# Patient Record
Sex: Female | Born: 1970
Health system: Southern US, Community
[De-identification: ages and names within clinical notes are randomized; demographics above are authoritative.]

## PROBLEM LIST (undated history)

## (undated) DIAGNOSIS — M359 Systemic involvement of connective tissue, unspecified: Secondary | ICD-10-CM

## (undated) DIAGNOSIS — F419 Anxiety disorder, unspecified: Secondary | ICD-10-CM

## (undated) DIAGNOSIS — Z87442 Personal history of urinary calculi: Secondary | ICD-10-CM

## (undated) DIAGNOSIS — I7300 Raynaud's syndrome without gangrene: Secondary | ICD-10-CM

## (undated) DIAGNOSIS — I10 Essential (primary) hypertension: Secondary | ICD-10-CM

## (undated) DIAGNOSIS — N200 Calculus of kidney: Secondary | ICD-10-CM

## (undated) DIAGNOSIS — B019 Varicella without complication: Secondary | ICD-10-CM

## (undated) DIAGNOSIS — N938 Other specified abnormal uterine and vaginal bleeding: Secondary | ICD-10-CM

## (undated) HISTORY — DX: Raynaud's syndrome without gangrene: I73.00

## (undated) HISTORY — DX: Varicella without complication: B01.9

## (undated) HISTORY — DX: Calculus of kidney: N20.0

## (undated) HISTORY — PX: TONSILLECTOMY: SUR1361

## (undated) HISTORY — DX: Other specified abnormal uterine and vaginal bleeding: N93.8

---
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
## 1994-02-06 HISTORY — PX: DILATION AND CURETTAGE OF UTERUS: SHX78

## 2007-02-09 ENCOUNTER — Ambulatory Visit: Payer: Self-pay | Admitting: Family Medicine

## 2007-02-14 ENCOUNTER — Ambulatory Visit: Payer: Self-pay | Admitting: Internal Medicine

## 2008-08-31 ENCOUNTER — Ambulatory Visit: Payer: Self-pay | Admitting: Unknown Physician Specialty

## 2008-09-21 ENCOUNTER — Ambulatory Visit: Payer: Self-pay | Admitting: Internal Medicine

## 2009-12-14 ENCOUNTER — Ambulatory Visit: Payer: Self-pay | Admitting: Unknown Physician Specialty

## 2009-12-27 ENCOUNTER — Ambulatory Visit: Payer: Self-pay | Admitting: Unknown Physician Specialty

## 2011-01-02 ENCOUNTER — Ambulatory Visit: Payer: Self-pay | Admitting: Unknown Physician Specialty

## 2012-03-05 ENCOUNTER — Ambulatory Visit: Payer: Self-pay

## 2013-02-07 ENCOUNTER — Ambulatory Visit: Payer: Self-pay | Admitting: Physician Assistant

## 2013-02-07 LAB — RAPID INFLUENZA A&B ANTIGENS

## 2013-03-12 ENCOUNTER — Ambulatory Visit: Payer: Self-pay | Admitting: Obstetrics and Gynecology

## 2013-12-04 DIAGNOSIS — M359 Systemic involvement of connective tissue, unspecified: Secondary | ICD-10-CM | POA: Insufficient documentation

## 2014-02-06 DIAGNOSIS — F419 Anxiety disorder, unspecified: Secondary | ICD-10-CM

## 2014-02-06 HISTORY — DX: Anxiety disorder, unspecified: F41.9

## 2014-04-29 ENCOUNTER — Ambulatory Visit: Payer: Self-pay | Admitting: Physician Assistant

## 2014-05-26 ENCOUNTER — Other Ambulatory Visit: Payer: Self-pay | Admitting: Obstetrics and Gynecology

## 2014-05-26 DIAGNOSIS — Z1231 Encounter for screening mammogram for malignant neoplasm of breast: Secondary | ICD-10-CM

## 2014-06-02 ENCOUNTER — Other Ambulatory Visit: Payer: Self-pay | Admitting: Obstetrics and Gynecology

## 2014-06-02 DIAGNOSIS — Z1231 Encounter for screening mammogram for malignant neoplasm of breast: Secondary | ICD-10-CM

## 2014-06-11 ENCOUNTER — Ambulatory Visit
Admission: RE | Admit: 2014-06-11 | Discharge: 2014-06-11 | Disposition: A | Discharge status: Home / Self Care | Payer: BLUE CROSS/BLUE SHIELD | Source: Ambulatory Visit | Attending: Obstetrics and Gynecology | Admitting: Obstetrics and Gynecology

## 2014-06-11 DIAGNOSIS — Z1231 Encounter for screening mammogram for malignant neoplasm of breast: Secondary | ICD-10-CM | POA: Diagnosis not present

## 2014-07-10 ENCOUNTER — Encounter: Payer: Self-pay | Admitting: Obstetrics and Gynecology

## 2014-07-10 ENCOUNTER — Ambulatory Visit (INDEPENDENT_AMBULATORY_CARE_PROVIDER_SITE_OTHER): Payer: BLUE CROSS/BLUE SHIELD | Admitting: Obstetrics and Gynecology

## 2014-07-10 VITALS — BP 128/85 | HR 62 | Ht 64.0 in | Wt 115.4 lb

## 2014-07-10 DIAGNOSIS — B029 Zoster without complications: Secondary | ICD-10-CM | POA: Diagnosis not present

## 2014-07-10 DIAGNOSIS — Z87442 Personal history of urinary calculi: Secondary | ICD-10-CM | POA: Insufficient documentation

## 2014-07-10 DIAGNOSIS — T783XXA Angioneurotic edema, initial encounter: Secondary | ICD-10-CM | POA: Insufficient documentation

## 2014-07-10 MED ORDER — ACYCLOVIR 800 MG PO TABS
800.0000 mg | ORAL_TABLET | Freq: Two times a day (BID) | ORAL | Status: DC
Start: 2014-07-10 — End: 2014-08-18

## 2014-07-10 MED ORDER — PREDNISOLONE 5 MG (48) PO TBPK: 1.0000 | ORAL_TABLET | Freq: Four times a day (QID) | ORAL | Status: DC

## 2014-07-10 NOTE — Evaluation (Signed)
SUBJECTIVE:  The patient has a 2* day history of a painful rash on the left back of thigh extending up to buttock. Also reports swollen lymph nodes that are not tender in right neck x 1 month. PMH: generally healthy. Has not had herpes zoster in the past. Has been really tired and under a lot of stress with mother passing away 6 weeks ago and having to care for [id number] yo grandmother  OBJECTIVE: Vital signs are normal, she appears well. Typical zoster lesions noted; vesicles on erythematous bases in clusters on the left back thigh and buttock area in a dermatomal pattern.  ASSESSMENT: Herpes Zoster (shingles)  PLAN: The nature of herpes zoster is explained carefully. Lesions should be compressed/soaked with saline, topical antibiotic ointment to any infected lesions; Aloe Vera cream may help minor local pain. Intervention with antiviral agents is extremely helpful early in the course of the disease, less helpful after 2-3 days of symptoms. Postherpetic neuralgia is explained; this may occur especially in the elderly despite every attempt at prevention. Prescription for acyclovir (Zovirax) and PredPax, which may shorten the course of acute symptoms and reduce the incidence of later neuralgia.CBC and varicella labs obtained.  The patient understands these issues, and will call as needed for further care.  Freman Lapage Ines BloomerBurr, CNM

## 2014-07-10 NOTE — Evaluation (Signed)
SUBJECTIVE:  The patient has a 2* day history of a painful rash on the left back of thigh extending up to buttock. Also reports swollen lymph nodes that are not tender in right neck x 1 month. PMH: generally healthy. Has not had herpes zoster in the past. Has been really tired and under a lot of stress with mother passing away 6 weeks ago and having to care for 46 yo grandmother  OBJECTIVE: Vital signs are normal, she appears well. Typical zoster lesions noted; vesicles on erythematous bases in clusters on the left back thigh and buttock area in a dermatomal pattern.  ASSESSMENT: Herpes Zoster (shingles)  PLAN: The nature of herpes zoster is explained carefully. Lesions should be compressed/soaked with saline, topical antibiotic ointment to any infected lesions; Aloe Vera cream may help minor local pain. Intervention with antiviral agents is extremely helpful early in the course of the disease, less helpful after 2-3 days of symptoms. Postherpetic neuralgia is explained; this may occur especially in the elderly despite every attempt at prevention. Prescription for acyclovir (Zovirax) and PredPax, which may shorten the course of acute symptoms and reduce the incidence of later neuralgia.CBC and varicella labs obtained.  The patient understands these issues, and will call as needed for further care.  Melody Burr, CNM 

## 2014-07-10 NOTE — Progress Notes (Signed)
SUBJECTIVE:  The patient has a 2* day history of a painful rash on the left back of thigh extending up to buttock. Also reports swollen lymph nodes that are not tender in right neck x 1 month. PMH: generally healthy. Has not had herpes zoster in the past. Has been really tired and under a lot of stress with mother passing away 6 weeks ago and having to care for 46 yo grandmother  OBJECTIVE: Vital signs are normal, she appears well. Typical zoster lesions noted; vesicles on erythematous bases in clusters on the left back thigh and buttock area in a dermatomal pattern.  ASSESSMENT: Herpes Zoster (shingles)  PLAN: The nature of herpes zoster is explained carefully. Lesions should be compressed/soaked with saline, topical antibiotic ointment to any infected lesions; Aloe Vera cream may help minor local pain. Intervention with antiviral agents is extremely helpful early in the course of the disease, less helpful after 2-3 days of symptoms. Postherpetic neuralgia is explained; this may occur especially in the elderly despite every attempt at prevention. Prescription for acyclovir (Zovirax) and PredPax, which may shorten the course of acute symptoms and reduce the incidence of later neuralgia.CBC and varicella labs obtained.  The patient understands these issues, and will call as needed for further care.  Maks Cavallero Burr, CNM 

## 2014-07-11 LAB — CBC WITH DIFFERENTIAL/PLATELET
BASOS: 1 %
Basophils Absolute: 0 10*3/uL (ref 0.0–0.2)
EOS (ABSOLUTE): 0.1 10*3/uL (ref 0.0–0.4)
Eos: 2 %
Hematocrit: 43.2 % (ref 34.0–46.6)
Hemoglobin: 14.7 g/dL (ref 11.1–15.9)
IMMATURE GRANS (ABS): 0 10*3/uL (ref 0.0–0.1)
Immature Granulocytes: 0 %
LYMPHS: 33 %
Lymphocytes Absolute: 1.8 10*3/uL (ref 0.7–3.1)
MCH: 32.8 pg (ref 26.6–33.0)
MCHC: 34 g/dL (ref 31.5–35.7)
MCV: 96 fL (ref 79–97)
Monocytes Absolute: 0.7 10*3/uL (ref 0.1–0.9)
Monocytes: 12 %
NEUTROS ABS: 2.9 10*3/uL (ref 1.4–7.0)
Neutrophils: 52 %
Platelets: 253 10*3/uL (ref 150–379)
RBC: 4.48 x10E6/uL (ref 3.77–5.28)
RDW: 12.8 % (ref 12.3–15.4)
WBC: 5.5 10*3/uL (ref 3.4–10.8)

## 2014-07-13 LAB — VARICELLA ZOSTER ABS, IGG/IGM

## 2014-07-14 NOTE — Progress Notes (Signed)
Quick Note:  Please let her know cbc is normal a dn varicela titer is not elevated, which is good, see if she is feeling ok ______

## 2014-07-17 ENCOUNTER — Telehealth: Payer: Self-pay | Admitting: Obstetrics and Gynecology

## 2014-07-17 NOTE — Telephone Encounter (Signed)
PLEASE ADVISE.

## 2014-07-17 NOTE — Telephone Encounter (Signed)
PT IS ON ESTRDIOL AND MISSED 1 DOSE. SHE IS BLEEDING NOW LIKE A REG PERIOD. AND HASN'T SINCE LAST JULY. PT IS CONCERNED SOMETHIING IS WRONG.

## 2014-07-17 NOTE — Telephone Encounter (Signed)
Please let her know it is not uncommon to bleed occasionally while taking the hormones, especially if misses a pill. If she has been bleeding >3d, have her stop both hormones x 4 days then restart. That should fix it, and if it doesn't  To come in to be seen

## 2014-07-17 NOTE — Telephone Encounter (Signed)
Notified pt she voiced understanding 

## 2014-08-18 ENCOUNTER — Encounter: Payer: Self-pay | Admitting: Obstetrics and Gynecology

## 2014-08-18 ENCOUNTER — Ambulatory Visit (INDEPENDENT_AMBULATORY_CARE_PROVIDER_SITE_OTHER): Payer: BLUE CROSS/BLUE SHIELD | Admitting: Obstetrics and Gynecology

## 2014-08-18 VITALS — BP 125/89 | HR 82 | Ht 64.0 in | Wt 117.4 lb

## 2014-08-18 DIAGNOSIS — N950 Postmenopausal bleeding: Secondary | ICD-10-CM | POA: Diagnosis not present

## 2014-08-18 MED ORDER — PROGESTERONE MICRONIZED 100 MG PO CAPS: 100.0000 mg | ORAL_CAPSULE | Freq: Every day | ORAL | Status: DC

## 2014-08-18 NOTE — Progress Notes (Signed)
Subjective:    Donna HighlandSusan Payne Jackson is a 44 y.o., post-menopausal female who presents for concerns regarding vaginal bleeding. She has been menopausal for 3 years. Currently on cyclical HRT and has been on this regimen for 10 months. Bleeding is described as heavier than a normal period, changing protection every 4 times a day, with previous menses being light for 2 days and has occurred 2 times. Other menopausal symptoms include: none. Workup to date: pelvic ultrasound. Done 09/2014  Menstrual History: OB History    No data available      Menarche age: [id number]  Patient's last menstrual period was 08/14/2014.    The following portions of the patient's history were reviewed and updated as appropriate: allergies, current medications, past family history, past medical history, past social history, past surgical history and problem list.  Review of Systems Pertinent items are noted in HPI.    Objective:    BP 125/89 mmHg  Pulse 82  Ht 5\' 4"  (1.626 m)  Wt 117 lb 6.4 oz (53.252 kg)  BMI 20.14 kg/m2  LMP 08/14/2014 BP 125/89 mmHg  Pulse 82  Ht 5\' 4"  (1.626 m)  Wt 117 lb 6.4 oz (53.252 kg)  BMI 20.14 kg/m2  LMP 08/14/2014  General Appearance:    Alert, cooperative, no distress, appears stated age  Head:    Normocephalic, without obvious abnormality, atraumatic  Eyes:    PERRL, conjunctiva/corneas clear, EOM's intact, fundi    benign, both eyes  Ears:    Normal TM's and external ear canals, both ears  Nose:   Nares normal, septum midline, mucosa normal, no drainage    or sinus tenderness  Throat:   Lips, mucosa, and tongue normal; teeth and gums normal  Neck:   Supple, symmetrical, trachea midline, no adenopathy;    thyroid:  no enlargement/tenderness/nodules; no carotid   bruit or JVD  Back:     Symmetric, no curvature, ROM normal, no CVA tenderness  Lungs:     Clear to auscultation bilaterally, respirations unlabored  Chest Wall:    No tenderness or deformity   Heart:    Regular  rate and rhythm, S1 and S2 normal, no murmur, rub   or gallop  Breast Exam:    No tenderness, masses, or nipple abnormality  Abdomen:     Soft, non-tender, bowel sounds active all four quadrants,    no masses, no organomegaly  Genitalia:    Normal female without lesion, discharge or tenderness  Rectal:    Normal tone, normal prostate, no masses or tenderness;   guaiac negative stool  Extremities:   Extremities normal, atraumatic, no cyanosis or edema  Pulses:   2+ and symmetric all extremities  Skin:   Skin color, texture, turgor normal, no rashes or lesions  Lymph nodes:   Cervical, supraclavicular, and axillary nodes normal  Neurologic:   CNII-XII intact, normal strength, sensation and reflexes    throughout     Assessment:    Postmenopausal bleeding   Plan:    All questions answered. Endometrial biopsy - see separate procedure note. Follow up as needed.   Restart prometrium as patient had self stopped a few months ago (can't remember why), new rx sent in.  Melody Ines BloomerBurr, CNM Endometrial Biopsy Procedure Note  Pre-operative Diagnosis: PMB  Post-operative Diagnosis: normal  Indications: postmenopausal bleeding  Procedure Details   Urine pregnancy test was not done.  The risks (including infection, bleeding, pain, and uterine perforation) and benefits of the procedure were explained to the  patient and Verbal informed consent was obtained.  Antibiotic prophylaxis against endocarditis was not indicated.   The patient was placed in the dorsal lithotomy position.  Bimanual exam showed the uterus to be in the neutral position.  A Graves' speculum inserted in the vagina, and the cervix prepped with povidone iodine.  Endocervical curettage with a Kevorkian curette was performed.   A sharp tenaculum was applied to the posterior lip of the cervix for stabilization.  A sterile uterine sound was used to sound the uterus to a depth of 8cm.  A Pipelle endometrial aspirator was used to sample  the endometrium.  Sample was sent for pathologic examination.  Condition: Stable  Complications: None  Plan:  The patient was advised to call for any fever or for prolonged or severe pain or bleeding. She was advised to use NSAID as needed for mild to moderate pain. She was advised to avoid vaginal intercourse for 48 hours or until the bleeding has completely stopped.  Attending Physician Documentation: I was present for or participated in the entire procedure, including opening and closing.

## 2014-08-18 NOTE — Patient Instructions (Signed)
  Thank you for enrolling in MyChart. Please follow the instructions below to securely access your online medical record. MyChart allows you to send messages to your doctor, view your test results, manage appointments, and more.   How Do I Sign Up? 1. In your Internet browser, go to [employer] and enter [email or web address]. 2. Click on the Sign Up Now link in the Sign In box. You will see the New Member Sign Up page. 3. Enter your MyChart Access Code exactly as it appears below. You will not need to use this code after you've completed the sign-up process. If you do not sign up before the expiration date, you must request a new code.  MyChart Access Code: [email or web address] Expires: 10/17/2014 12:44 PM  4. Enter your Social Security Number ([id number]) and Date of Birth (mm/dd/yyyy) as indicated and click Submit. You will be taken to the next sign-up page. 5. Create a MyChart ID. This will be your MyChart login ID and cannot be changed, so think of one that is secure and easy to remember. 6. Create a MyChart password. You can change your password at any time. 7. Enter your Password Reset Question and Answer. This can be used at a later time if you forget your password.  8. Enter your e-mail address. You will receive e-mail notification when new information is available in MyChart. 9. Click Sign Up. You can now view your medical record.   Additional Information Remember, MyChart is NOT to be used for urgent needs. For medical emergencies, dial 911.    Place endometrial biopsy patient instructions here.

## 2014-08-20 ENCOUNTER — Encounter: Payer: Self-pay | Admitting: Obstetrics and Gynecology

## 2014-08-21 ENCOUNTER — Telehealth: Payer: Self-pay | Admitting: Obstetrics and Gynecology

## 2014-08-21 NOTE — Telephone Encounter (Signed)
Reassure patient that it can be normal after biopsy- as long as she is not saturating a pad an hour, no worries, and to take aleeve 2 tablets every 6 hours for cramping

## 2014-08-21 NOTE — Telephone Encounter (Signed)
Pt aware. Advised to contact office Monday if bleeding doesn't slow down or cramps no relived by aleve. She is taking Prometrium as rx.

## 2014-08-21 NOTE — Telephone Encounter (Signed)
After bx, still bleeding, more pain now.

## 2014-08-24 ENCOUNTER — Telehealth: Payer: Self-pay | Admitting: *Deleted

## 2014-08-24 NOTE — Telephone Encounter (Signed)
Pt is still having bleeding and cramping, states her bleeding was much worse over the weekend, today is slightly better Pt is very concerned about this, advised would speak with MNB 08/25/2014, she voiced understanding

## 2014-08-25 ENCOUNTER — Other Ambulatory Visit: Payer: Self-pay | Admitting: *Deleted

## 2014-08-25 DIAGNOSIS — N939 Abnormal uterine and vaginal bleeding, unspecified: Secondary | ICD-10-CM

## 2014-08-25 NOTE — Telephone Encounter (Signed)
Please have her come in for ultrasound and to see me afterwards

## 2014-08-25 NOTE — Telephone Encounter (Signed)
Hey can you schedule pt and us for dub??

## 2014-08-25 NOTE — Telephone Encounter (Signed)
DONE

## 2014-08-26 ENCOUNTER — Encounter: Payer: Self-pay | Admitting: Obstetrics and Gynecology

## 2014-08-26 ENCOUNTER — Ambulatory Visit (INDEPENDENT_AMBULATORY_CARE_PROVIDER_SITE_OTHER): Payer: BLUE CROSS/BLUE SHIELD | Admitting: Obstetrics and Gynecology

## 2014-08-26 ENCOUNTER — Ambulatory Visit: Payer: BLUE CROSS/BLUE SHIELD

## 2014-08-26 VITALS — BP 131/86 | HR 71 | Ht 64.0 in | Wt 116.6 lb

## 2014-08-26 DIAGNOSIS — N938 Other specified abnormal uterine and vaginal bleeding: Secondary | ICD-10-CM

## 2014-08-26 DIAGNOSIS — N939 Abnormal uterine and vaginal bleeding, unspecified: Secondary | ICD-10-CM | POA: Diagnosis not present

## 2014-08-26 NOTE — Progress Notes (Signed)
Patient ID: Donna Jackson, female   DOB: [birth year], 44 y.o.   MRN: [id number]  Here for ultrasound for PBM:   Indications: DUB Findings:  The uterus measures 6.9 x 3.7 x 4.1 cm Echo texture is homogenous, without evidence of focal masses.  The Endometrium measures 6.2 mm.  Right Ovary measures 1.6 x 1.1 x 1.4 cm. There is a dominant 6 mm follicle seen, otherwise, appears wnl. Left Ovary measures 2.5 x 1.8 x 1.8 cm. It is normal appearance. There is a simple appearing 2 cm cyst seen. Survey of the adnexa demonstrates no adnexal masses. There is no free fluid in the cul de sac.  Impression: 1. Normal appearing uterus and endometrium 2. 2 cm simple appearing left cyst.   P: reviewed scan with Donna Jackson- to stop prometrium in 3 days and stop estridiol now, to chart any bleeding seen and call me early September to restart Estrodiol at 0.5mg  and Prometrium at 100mg  as previously taken.    Donna Jackson, CNM

## 2014-08-28 ENCOUNTER — Other Ambulatory Visit: Payer: BLUE CROSS/BLUE SHIELD

## 2014-08-28 ENCOUNTER — Ambulatory Visit: Payer: BLUE CROSS/BLUE SHIELD | Admitting: Obstetrics and Gynecology

## 2014-09-22 ENCOUNTER — Ambulatory Visit (INDEPENDENT_AMBULATORY_CARE_PROVIDER_SITE_OTHER): Payer: BLUE CROSS/BLUE SHIELD | Admitting: Obstetrics and Gynecology

## 2014-09-22 ENCOUNTER — Encounter: Payer: Self-pay | Admitting: Obstetrics and Gynecology

## 2014-09-22 VITALS — BP 149/95 | HR 76 | Ht 64.0 in | Wt 114.4 lb

## 2014-09-22 DIAGNOSIS — Z780 Asymptomatic menopausal state: Secondary | ICD-10-CM

## 2014-09-22 DIAGNOSIS — N950 Postmenopausal bleeding: Secondary | ICD-10-CM | POA: Diagnosis not present

## 2014-09-22 DIAGNOSIS — Z79890 Hormone replacement therapy (postmenopausal): Secondary | ICD-10-CM | POA: Insufficient documentation

## 2014-09-22 MED ORDER — MEDROXYPROGESTERONE ACETATE 10 MG PO TABS: 30.0000 mg | ORAL_TABLET | Freq: Every day | ORAL | Status: DC

## 2014-09-22 NOTE — Progress Notes (Signed)
Patient ID: Donna Jackson, female   DOB: 12-08-1970, 44 y.o.   MRN: [id number] PMB x 1 month Normal u/s and EMB 08/2014- see chart review D/c HRT 08/28/2014 Last 2 days of heavy bleeding with cramps taking tylenol  Chief complaint: 1.  Postmenopausal bleeding.  SUBJECTIVE: Patient is a 44 year old white female, para 24, menopausal 4 years, previously on HRT therapy until August 28, 2014, who presents for follow-up regarding recent episode of postmenopausal bleeding.  Bleeding is described as extremely heavy with flooding and clots. Workup has included endometrial biopsy showing disordered proliferative endometrium without evidence of hyperplasia; pelvic ultrasound has demonstrated a grossly normal uterus with endometrial stripe measuring 6.1 mm, a 2.4 cm simple ovarian cyst on left ovary, and a dominant follicle 6 mm on right ovary.  OBJECTIVE: BP 149/95 mmHg  Pulse 76  Ht  (1.626 m)  Wt 114 lb 6.4 oz (51.891 kg)  BMI 19.63 kg/m2  LMP 09/21/2014 Patient is a pleasant, well-appearing white female in no acute distress. Back: No CVA tenderness. Abdomen: Soft, nontender, without organomegaly; Pfannenstiel skin incision well healed without evidence of hernia. Pelvic: Blood stained perineum.  Extra genitalia: Normal  BUS: Normal.  Vagina: Atrophic changes of the vaginal mucosa; thin bloody vaginal discharge with clots, burgundy in color   Cervix: No lesions; blood is coming from os.  Uterus: Mid plane, normal size and shape, nontender Adnexa: Nonpalpable, nontender   Rectovaginal: Normal.  External exam.  IMPRESSION: 1.  Postmenopausal bleeding while on HRT therapy with negative workup based on pelvic ultrasound and endometrial biopsy.  PLAN: 1.  Provera 30 g daily for 30 days to suppress bleeding. 2.  Return in 6 weeks for follow-up. 3.  Reassess need for further HRT therapy at follow-up. 4.  Alternative interventions were reviewed including possible endometrial ablation or  hysterectomy.  A total of 15 minutes were spent face-to-face with the patient during this encounter and over half of that time dealt with counseling and coordination of care.   Herold Harms, MD

## 2014-09-22 NOTE — Patient Instructions (Signed)
1. Provera 30 mg a day for 30 days. 2. MVI with iron daily. 3. Return in 6 weeks for F/U.

## 2014-09-25 ENCOUNTER — Other Ambulatory Visit: Payer: Self-pay | Admitting: Obstetrics and Gynecology

## 2014-09-25 DIAGNOSIS — J329 Chronic sinusitis, unspecified: Secondary | ICD-10-CM | POA: Insufficient documentation

## 2014-09-25 MED ORDER — AZITHROMYCIN 250 MG PO TABS: ORAL_TABLET | ORAL | Status: DC

## 2014-10-29 ENCOUNTER — Other Ambulatory Visit: Payer: Self-pay

## 2014-10-29 MED ORDER — MEDROXYPROGESTERONE ACETATE 10 MG PO TABS: 30.0000 mg | ORAL_TABLET | Freq: Every day | ORAL | Status: DC

## 2014-10-29 NOTE — Telephone Encounter (Signed)
Pt called DC and informed her she was bleeding after completing provera  for 1 month. Ok per [facility] to refill provera until pt is seen by mad on 10/4.

## 2014-11-10 ENCOUNTER — Ambulatory Visit (INDEPENDENT_AMBULATORY_CARE_PROVIDER_SITE_OTHER): Payer: BLUE CROSS/BLUE SHIELD | Admitting: Obstetrics and Gynecology

## 2014-11-10 ENCOUNTER — Encounter: Payer: Self-pay | Admitting: Obstetrics and Gynecology

## 2014-11-10 VITALS — BP 141/94 | HR 67 | Ht 64.0 in | Wt 122.5 lb

## 2014-11-10 DIAGNOSIS — N950 Postmenopausal bleeding: Secondary | ICD-10-CM

## 2014-11-10 DIAGNOSIS — Z23 Encounter for immunization: Secondary | ICD-10-CM

## 2014-11-10 MED ORDER — INFLUENZA VAC SPLIT QUAD 0.5 ML IM SUSY
0.5000 mL | PREFILLED_SYRINGE | Freq: Once | INTRAMUSCULAR | Status: AC
  Administered 2014-11-10: 0.5 mL via INTRAMUSCULAR

## 2014-11-10 NOTE — Progress Notes (Signed)
Patient ID: Donna Jackson, female   DOB: April 26, 1970, 44 y.o.   MRN: [id number]  Chief complaint: 1.  Postmenopausal bleeding. 2.  Flu shot desired   6 WEEK F/U ON PMB ON PROVERA  QD STOPPED PROVERA 9/18 THEN STARTED BLEEDING 4 DAYS LATER GIVEN MORE PROVERA UNTIL THIS VISIT PT UNSURE OF NEXT STEP   Endometrial biopsy was benign.  Patient had withdrawal bleed following a 30 day course of high-dose Provera.  She has since been placed back on the Provera regimen in order to avoid vaginal bleeding.  Options of management have been reviewed: 1.  Observation only. 2.  Cyclic progestin withdrawal with menstrual calendar, monitoring 3.  Hysteroscopy/D&C with NovaSure endometrial ablation. 4.  Hysterectomy.  Pros and cons of management options were discussed.   PLAN: 1. Patient is willing to proceed with trial of hysteroscopy/D&C with NovaSure endometrial ablation. 2.  Patient will continue to take her daily Provera until surgery. 3.  She will return for preop appointment. 4.  Flu shot is given.  A total of 15 minutes were spent face-to-face with the patient during this encounter and over half of that time dealt with counseling and coordination of care.  Herold Harms, MD

## 2014-11-10 NOTE — Patient Instructions (Addendum)
1.  Continue with Provera daily. 2.  Return for preop appointment. 3.  Hysteroscopy/D&C with NovaSure endometrial ablation is scheduled. 4.  Literature Regarding procedures is given. 5.  Flu shot given.

## 2014-11-12 ENCOUNTER — Encounter: Payer: BLUE CROSS/BLUE SHIELD | Admitting: Obstetrics and Gynecology

## 2014-11-18 ENCOUNTER — Inpatient Hospital Stay: Admission: RE | Admit: 2014-11-18 | Payer: BLUE CROSS/BLUE SHIELD | Source: Ambulatory Visit

## 2014-11-18 ENCOUNTER — Other Ambulatory Visit: Payer: BLUE CROSS/BLUE SHIELD

## 2014-11-18 ENCOUNTER — Ambulatory Visit (INDEPENDENT_AMBULATORY_CARE_PROVIDER_SITE_OTHER): Payer: BLUE CROSS/BLUE SHIELD | Admitting: Obstetrics and Gynecology

## 2014-11-18 ENCOUNTER — Encounter
Admission: RE | Admit: 2014-11-18 | Discharge: 2014-11-18 | Disposition: A | Discharge status: Home / Self Care | Payer: BLUE CROSS/BLUE SHIELD | Source: Ambulatory Visit | Attending: Obstetrics and Gynecology | Admitting: Obstetrics and Gynecology

## 2014-11-18 ENCOUNTER — Encounter: Payer: Self-pay | Admitting: Obstetrics and Gynecology

## 2014-11-18 VITALS — BP 148/90 | HR 73 | Ht 64.0 in | Wt 122.4 lb

## 2014-11-18 DIAGNOSIS — Z01818 Encounter for other preprocedural examination: Secondary | ICD-10-CM | POA: Insufficient documentation

## 2014-11-18 DIAGNOSIS — N950 Postmenopausal bleeding: Secondary | ICD-10-CM

## 2014-11-18 DIAGNOSIS — N938 Other specified abnormal uterine and vaginal bleeding: Secondary | ICD-10-CM

## 2014-11-18 HISTORY — DX: Systemic involvement of connective tissue, unspecified: M35.9

## 2014-11-18 HISTORY — DX: Essential (primary) hypertension: I10

## 2014-11-18 HISTORY — DX: Anxiety disorder, unspecified: F41.9

## 2014-11-18 LAB — CBC WITH DIFFERENTIAL/PLATELET
BASOS ABS: 0.1 10*3/uL (ref 0–0.1)
BASOS PCT: 1 %
EOS ABS: 0.1 10*3/uL (ref 0–0.7)
Eosinophils Relative: 1 %
HCT: 42.2 % (ref 35.0–47.0)
Hemoglobin: 14.2 g/dL (ref 12.0–16.0)
LYMPHS PCT: 35 %
Lymphs Abs: 2.1 10*3/uL (ref 1.0–3.6)
MCH: 32.9 pg (ref 26.0–34.0)
MCHC: 33.7 g/dL (ref 32.0–36.0)
MCV: 97.8 fL (ref 80.0–100.0)
MONO ABS: 0.8 10*3/uL (ref 0.2–0.9)
Monocytes Relative: 13 %
Neutro Abs: 3.1 10*3/uL (ref 1.4–6.5)
Neutrophils Relative %: 50 %
PLATELETS: 222 10*3/uL (ref 150–440)
RBC: 4.31 MIL/uL (ref 3.80–5.20)
RDW: 12.3 % (ref 11.5–14.5)
WBC: 6.1 10*3/uL (ref 3.6–11.0)

## 2014-11-18 LAB — RAPID HIV SCREEN (HIV 1/2 AB+AG)
HIV 1/2 Antibodies: NONREACTIVE
HIV-1 P24 ANTIGEN - HIV24: NONREACTIVE

## 2014-11-18 NOTE — Patient Instructions (Signed)
  Your procedure is scheduled on: November 23, 2014(Monday) Report to Day Surgery.[facility] To find out your arrival time please call [phone] between 1PM - 3PM on November 20, 2014(Friday).  Remember: Instructions that are not followed completely may result in serious medical risk, up to and including death, or upon the discretion of your surgeon and anesthesiologist your surgery may need to be rescheduled.    __x__ 1. Do not eat food or drink liquids after midnight. No gum chewing or hard candies.     ____ 2. No Alcohol for 24 hours before or after surgery.   ____ 3. Bring all medications with you on the day of surgery if instructed.    __x__ 4. Notify your doctor if there is any change in your medical condition     (cold, fever, infections).     Do not wear jewelry, make-up, hairpins, clips or nail polish.  Do not wear lotions, powders, or perfumes. You may wear deodorant.  Do not shave 48 hours prior to surgery. Men may shave face and neck.  Do not bring valuables to the hospital.    [facility] for any belongings or valuables.               Contacts, dentures or bridgework may not be worn into surgery.  Leave your suitcase in the car. After surgery it may be brought to your room.  For patients admitted to the hospital, discharge time is determined by your                treatment team.   Patients discharged the day of surgery will not be allowed to drive home.  Please read over the following fact sheets that you were given:    __x__ Take these medicines the morning of surgery with A SIP OF WATER:    1. Xanax  2.   3.   4.  5.  6.  ____ Fleet Enema (as directed)   ____ Use CHG Soap as directed  ____ Use inhalers on the day of surgery  ____ Stop metformin 2 days prior to surgery    ____ Take 1/2 of usual insulin dose the night before surgery and none on the morning of surgery.   ____ Stop Coumadin/Plavix/aspirin on   __x__ Stop  Anti-inflammatories on (Tylenol ok to take for pain if needed)   ____ Stop supplements until after surgery.    ____ Bring C-Pap to the hospital.

## 2014-11-18 NOTE — Progress Notes (Signed)
Subjective:    Patient is a 44 y.o. G2P1084female scheduled for hysteroscopy/D&C with NovaSure endometrial ablation. Indications for procedure are menorrhagia refractory to medical therapy.   Patient is a 44 year old white female, para 30, menopausal 4 years, previously on HRT therapy until August 28, 2014, who presents for follow-up regarding recent episode of postmenopausal bleeding. Bleeding is described as extremely heavy with flooding and clots. Workup has included endometrial biopsy showing disordered proliferative endometrium without evidence of hyperplasia; pelvic ultrasound has demonstrated a grossly normal uterus with endometrial stripe measuring 6.1 mm, a 2.4 cm simple ovarian cyst on left ovary, and a dominant follicle 6 mm on right ovary.  Pertinent Gynecological History: Menses: flow is excessive with use of many pads or tampons on heaviest days Bleeding: intermenstrual bleeding and dysfunctional uterine bleeding Contraception: none Last mammogram: normal Last pap: normal  Discussed Blood/Blood Products: no   Menstrual History: OB History    Gravida Para Term Preterm AB TAB SAB Ectopic Multiple Living   Menarche age: na  Patient's last menstrual period was 09/21/2014.    Past Medical History  Diagnosis Date  . Kidney stones   . Raynauds syndrome   . DUB (dysfunctional uterine bleeding)   . Lupus (HCC)   . Shingles 06/2014  . Hypertension   . Anxiety   . Arthritis   . Connective tissue disease [facility])     Past Surgical History  Procedure Laterality Date  . Cesarean section  1997  . Dilation and curettage of uterus  1996  . Tonsillectomy and adenoidectomy  1982  . Tonsillectomy      OB History  Gravida Para Term Preterm AB SAB TAB Ectopic Multiple Living  # Outcome Date GA Lbr Len/2nd Weight Sex Delivery Anes PTL Lv  2 Term 1997   8 lb 6.4 oz (3.81 kg) M CS-LTranv   Y  1 SAB 1996              Social History    Social History  . Marital Status: Married    Spouse Name: N/A  . Number of Children: N/A  . Years of Education: N/A   Social History Main Topics  . Smoking status: Never Smoker   . Smokeless tobacco: Never Used  . Alcohol Use: Yes     Comment: occas  . Drug Use: No  . Sexual Activity: Yes    Birth Control/ Protection: None   Other Topics Concern  . None   Social History Narrative    Family History  Problem Relation Age of Onset  . Hypertension Mother   . Heart disease Father   . Hypertension Father   . Diabetes Maternal Grandmother      (Not in a hospital admission)  Allergies  Allergen Reactions  . Celecoxib Shortness Of Breath  . Sulfa Antibiotics Shortness Of Breath  . Amlodipine Other (See Comments)    "headaches"  . Levofloxacin Other (See Comments)    questionable    Review of Systems Constitutional: No recent fever/chills/sweats Respiratory: No recent cough/bronchitis Cardiovascular: No chest pain Gastrointestinal: No recent nausea/vomiting/diarrhea Genitourinary: No UTI symptoms Hematologic/lymphatic:No history of coagulopathy or recent blood thinner use    Objective:    BP 148/90 mmHg  Pulse 73  Ht  (1.626 m)  Wt 122 lb 6 oz (55.509 kg)  BMI 21.00 kg/m2  LMP 09/21/2014  General:   Normal  Skin:   normal  HEENT:  Normal  Neck:  Supple without Adenopathy or Thyromegaly  Lungs:   Heart:              Breasts:   Abdomen:  Pelvis:  M/S   Extremeties:  Neuro:    clear to auscultation bilaterally   Normal without murmur   Not Examined   soft, non-tender; bowel sounds normal; no masses,  no organomegaly   Exam deferred to OR  No CVAT  Warm/Dry   Normal          Assessment:    Abnormal uterine bleeding (menorrhagia), refractory to conservative medical therapy   Plan:  Hysteroscopy/D&C with NovaSure endometrial ablation   Counseling: Procedure, risks, reasons, benefits and complications (including injury to bowel,  bladder, major blood vessel, ureter, bleeding, possibility of transfusion, infection, or fistula formation) reviewed in detail. Consent signed. Preop testing ordered. Instructions reviewed, including NPO after midnight.

## 2014-11-19 ENCOUNTER — Telehealth: Payer: Self-pay | Admitting: Obstetrics and Gynecology

## 2014-11-19 LAB — RPR: RPR: NONREACTIVE

## 2014-11-19 MED ORDER — MEDROXYPROGESTERONE ACETATE 10 MG PO TABS: 10.0000 mg | ORAL_TABLET | Freq: Every day | ORAL | Status: DC

## 2014-11-19 NOTE — Telephone Encounter (Signed)
Pt notified that we would send in Provera 10 mg. 1 po daily, #4, no refills.

## 2014-11-19 NOTE — Telephone Encounter (Signed)
SHE WAS HERE YESTERDAY AND SHE WANTED TO KNOW IF DR DE COULD SEND IN A RX FOR HER PROVERA SHE TOOK THE LAST ONE YESTERDAY, SHE JUST WANTED 4 PILLS TOTAL SINCE SHE IS HAVING SURGERY ON Monday AND SHE WANTED TO KNOW IF SHE COULD JUST GET TEH 4 JUST TO LAST HER SO SHE DOESN'T START BLEEDING,[facility] [place] [facility] [place]

## 2014-11-23 ENCOUNTER — Encounter: Payer: Self-pay | Admitting: *Deleted

## 2014-11-23 ENCOUNTER — Encounter
Admission: RE | Disposition: A | Discharge status: Home / Self Care | Payer: Self-pay | Source: Ambulatory Visit | Attending: Obstetrics and Gynecology

## 2014-11-23 ENCOUNTER — Ambulatory Visit: Payer: BLUE CROSS/BLUE SHIELD | Admitting: *Deleted

## 2014-11-23 ENCOUNTER — Ambulatory Visit
Admission: RE | Admit: 2014-11-23 | Discharge: 2014-11-23 | Disposition: A | Discharge status: Home / Self Care | Payer: BLUE CROSS/BLUE SHIELD | Source: Ambulatory Visit | Attending: Obstetrics and Gynecology | Admitting: Obstetrics and Gynecology

## 2014-11-23 DIAGNOSIS — M329 Systemic lupus erythematosus, unspecified: Secondary | ICD-10-CM | POA: Diagnosis not present

## 2014-11-23 DIAGNOSIS — Z882 Allergy status to sulfonamides status: Secondary | ICD-10-CM | POA: Diagnosis not present

## 2014-11-23 DIAGNOSIS — Z87442 Personal history of urinary calculi: Secondary | ICD-10-CM | POA: Diagnosis not present

## 2014-11-23 DIAGNOSIS — N950 Postmenopausal bleeding: Secondary | ICD-10-CM | POA: Insufficient documentation

## 2014-11-23 DIAGNOSIS — Z888 Allergy status to other drugs, medicaments and biological substances status: Secondary | ICD-10-CM | POA: Insufficient documentation

## 2014-11-23 DIAGNOSIS — I7300 Raynaud's syndrome without gangrene: Secondary | ICD-10-CM | POA: Diagnosis not present

## 2014-11-23 DIAGNOSIS — I10 Essential (primary) hypertension: Secondary | ICD-10-CM | POA: Insufficient documentation

## 2014-11-23 DIAGNOSIS — F419 Anxiety disorder, unspecified: Secondary | ICD-10-CM | POA: Insufficient documentation

## 2014-11-23 DIAGNOSIS — N920 Excessive and frequent menstruation with regular cycle: Secondary | ICD-10-CM | POA: Insufficient documentation

## 2014-11-23 DIAGNOSIS — M359 Systemic involvement of connective tissue, unspecified: Secondary | ICD-10-CM | POA: Diagnosis not present

## 2014-11-23 DIAGNOSIS — Z8249 Family history of ischemic heart disease and other diseases of the circulatory system: Secondary | ICD-10-CM | POA: Insufficient documentation

## 2014-11-23 DIAGNOSIS — Z8669 Personal history of other diseases of the nervous system and sense organs: Secondary | ICD-10-CM | POA: Diagnosis not present

## 2014-11-23 DIAGNOSIS — Z833 Family history of diabetes mellitus: Secondary | ICD-10-CM | POA: Diagnosis not present

## 2014-11-23 DIAGNOSIS — M1990 Unspecified osteoarthritis, unspecified site: Secondary | ICD-10-CM | POA: Insufficient documentation

## 2014-11-23 HISTORY — PX: DILATATION & CURRETTAGE/HYSTEROSCOPY WITH RESECTOCOPE: SHX5572

## 2014-11-23 SURGERY — DILATATION & CURETTAGE/HYSTEROSCOPY WITH RESECTOCOPE
Anesthesia: General | Site: Uterus | Wound class: Clean Contaminated

## 2014-11-23 MED ORDER — MIDAZOLAM HCL 2 MG/2ML IJ SOLN
INTRAMUSCULAR | Status: DC | PRN
  Administered 2014-11-23: 2 mg via INTRAVENOUS

## 2014-11-23 MED ORDER — PROPOFOL 10 MG/ML IV BOLUS
INTRAVENOUS | Status: DC | PRN
  Administered 2014-11-23: 50 mg via INTRAVENOUS
  Administered 2014-11-23: 150 mg via INTRAVENOUS

## 2014-11-23 MED ORDER — KETOROLAC TROMETHAMINE 30 MG/ML IJ SOLN
INTRAMUSCULAR | Status: DC | PRN
Start: 2014-11-23 — End: 2014-11-23
  Administered 2014-11-23: 30 mg via INTRAVENOUS

## 2014-11-23 MED ORDER — ONDANSETRON HCL 4 MG/2ML IJ SOLN
INTRAMUSCULAR | Status: DC | PRN
  Administered 2014-11-23: 4 mg via INTRAVENOUS

## 2014-11-23 MED ORDER — LIDOCAINE HCL (CARDIAC) 20 MG/ML IV SOLN
INTRAVENOUS | Status: DC | PRN
  Administered 2014-11-23: 50 mg via INTRAVENOUS

## 2014-11-23 MED ORDER — ONDANSETRON HCL 4 MG/2ML IJ SOLN: 4.0000 mg | Freq: Once | INTRAMUSCULAR | Status: DC | PRN

## 2014-11-23 MED ORDER — FENTANYL CITRATE (PF) 100 MCG/2ML IJ SOLN
INTRAMUSCULAR | Status: DC | PRN
  Administered 2014-11-23: 25 ug via INTRAVENOUS
  Administered 2014-11-23: 50 ug via INTRAVENOUS
  Administered 2014-11-23: 25 ug via INTRAVENOUS

## 2014-11-23 MED ORDER — FENTANYL CITRATE (PF) 100 MCG/2ML IJ SOLN: 25.0000 ug | INTRAMUSCULAR | Status: DC | PRN

## 2014-11-23 MED ORDER — LACTATED RINGERS IV SOLN
INTRAVENOUS | Status: DC
  Administered 2014-11-23 (×2): via INTRAVENOUS

## 2014-11-23 MED ORDER — OXYCODONE-ACETAMINOPHEN 5-325 MG PO TABS: 1.0000 | ORAL_TABLET | ORAL | Status: DC | PRN

## 2014-11-23 MED ORDER — DEXAMETHASONE SODIUM PHOSPHATE 4 MG/ML IJ SOLN
INTRAMUSCULAR | Status: DC | PRN
  Administered 2014-11-23: 4 mg via INTRAVENOUS

## 2014-11-23 SURGICAL SUPPLY — 14 items
CATH ROBINSON RED A/P 16FR (CATHETERS) ×3 IMPLANT
GLOVE BIO SURGEON STRL SZ8 (GLOVE) ×6 IMPLANT
GOWN STRL REUS W/ TWL LRG LVL3 (GOWN DISPOSABLE) ×1 IMPLANT
GOWN STRL REUS W/ TWL XL LVL3 (GOWN DISPOSABLE) ×1 IMPLANT
GOWN STRL REUS W/TWL LRG LVL3 (GOWN DISPOSABLE) ×2
GOWN STRL REUS W/TWL XL LVL3 (GOWN DISPOSABLE) ×2
IV LACTATED RINGERS 1000ML (IV SOLUTION) ×3 IMPLANT
KIT RM TURNOVER CYSTO AR (KITS) ×3 IMPLANT
NOVASURE ENDOMETRIAL ABLATION (MISCELLANEOUS) ×3 IMPLANT
NS IRRIG 500ML POUR BTL (IV SOLUTION) ×3 IMPLANT
PACK DNC HYST (MISCELLANEOUS) ×3 IMPLANT
PAD OB MATERNITY 4.3X12.25 (PERSONAL CARE ITEMS) ×3 IMPLANT
PAD PREP 24X41 OB/GYN DISP (PERSONAL CARE ITEMS) ×3 IMPLANT
SURGILUBE 2OZ TUBE FLIPTOP (MISCELLANEOUS) ×3 IMPLANT

## 2014-11-23 NOTE — H&P (View-Only) (Signed)
Subjective:    Patient is a 44 y.o. G2P1011female scheduled for hysteroscopy/D&C with NovaSure endometrial ablation. Indications for procedure are menorrhagia refractory to medical therapy.   Patient is a 44-year-old white female, para 1011, menopausal 4 years, previously on HRT therapy until August 28, 2014, who presents for follow-up regarding recent episode of postmenopausal bleeding. Bleeding is described as extremely heavy with flooding and clots. Workup has included endometrial biopsy showing disordered proliferative endometrium without evidence of hyperplasia; pelvic ultrasound has demonstrated a grossly normal uterus with endometrial stripe measuring 6.1 mm, a 2.4 cm simple ovarian cyst on left ovary, and a dominant follicle 6 mm on right ovary.  Pertinent Gynecological History: Menses: flow is excessive with use of many pads or tampons on heaviest days Bleeding: intermenstrual bleeding and dysfunctional uterine bleeding Contraception: none Last mammogram: normal Last pap: normal  Discussed Blood/Blood Products: no   Menstrual History: OB History    Gravida Para Term Preterm AB TAB SAB Ectopic Multiple Living   2 1 1  1  1   1      Menarche age: na  Patient's last menstrual period was 09/21/2014.    Past Medical History  Diagnosis Date  . Kidney stones   . Raynauds syndrome   . DUB (dysfunctional uterine bleeding)   . Lupus (HCC)   . Shingles 06/2014  . Hypertension   . Anxiety   . Arthritis   . Connective tissue disease (HCC)     Past Surgical History  Procedure Laterality Date  . Cesarean section  1997  . Dilation and curettage of uterus  1996  . Tonsillectomy and adenoidectomy  1982  . Tonsillectomy      OB History  Gravida Para Term Preterm AB SAB TAB Ectopic Multiple Living  2 1 1  1 1    1    # Outcome Date GA Lbr Len/2nd Weight Sex Delivery Anes PTL Lv  2 Term 1997   8 lb 6.4 oz (3.81 kg) M CS-LTranv   Y  1 SAB 1996              Social History    Social History  . Marital Status: Married    Spouse Name: N/A  . Number of Children: N/A  . Years of Education: N/A   Social History Main Topics  . Smoking status: Never Smoker   . Smokeless tobacco: Never Used  . Alcohol Use: Yes     Comment: occas  . Drug Use: No  . Sexual Activity: Yes    Birth Control/ Protection: None   Other Topics Concern  . None   Social History Narrative    Family History  Problem Relation Age of Onset  . Hypertension Mother   . Heart disease Father   . Hypertension Father   . Diabetes Maternal Grandmother      (Not in a hospital admission)  Allergies  Allergen Reactions  . Celecoxib Shortness Of Breath  . Sulfa Antibiotics Shortness Of Breath  . Amlodipine Other (See Comments)    "headaches"  . Levofloxacin Other (See Comments)    questionable    Review of Systems Constitutional: No recent fever/chills/sweats Respiratory: No recent cough/bronchitis Cardiovascular: No chest pain Gastrointestinal: No recent nausea/vomiting/diarrhea Genitourinary: No UTI symptoms Hematologic/lymphatic:No history of coagulopathy or recent blood thinner use    Objective:    BP 148/90 mmHg  Pulse 73  Ht 5' 4" (1.626 m)  Wt 122 lb 6 oz (55.509 kg)  BMI 21.00 kg/m2    LMP 09/21/2014  General:   Normal  Skin:   normal  HEENT:  Normal  Neck:  Supple without Adenopathy or Thyromegaly  Lungs:   Heart:              Breasts:   Abdomen:  Pelvis:  M/S   Extremeties:  Neuro:    clear to auscultation bilaterally   Normal without murmur   Not Examined   soft, non-tender; bowel sounds normal; no masses,  no organomegaly   Exam deferred to OR  No CVAT  Warm/Dry   Normal          Assessment:    Abnormal uterine bleeding (menorrhagia), refractory to conservative medical therapy   Plan:  Hysteroscopy/D&C with NovaSure endometrial ablation   Counseling: Procedure, risks, reasons, benefits and complications (including injury to bowel,  bladder, major blood vessel, ureter, bleeding, possibility of transfusion, infection, or fistula formation) reviewed in detail. Consent signed. Preop testing ordered. Instructions reviewed, including NPO after midnight.

## 2014-11-23 NOTE — Transfer of Care (Signed)
Immediate Anesthesia Transfer of Care Note  Patient: Donna Jackson  Procedure(s) Performed: Procedure(s): DILATATION & CURETTAGE/HYSTEROSCOPY WITH RESECTOCOPE (N/A)  Patient Location: PACU  Anesthesia Type:General  Level of Consciousness: sedated  Airway & Oxygen Therapy: Patient Spontanous Breathing and Patient connected to nasal cannula oxygen  Post-op Assessment: Report given to RN and Post -op Vital signs reviewed and stable  Post vital signs: Reviewed and stable  Last Vitals:  Filed Vitals:   11/23/14 0747  BP: 151/97  Pulse: 80  Temp: 36 C  Resp: 16    Complications: No apparent anesthesia complications

## 2014-11-23 NOTE — Anesthesia Postprocedure Evaluation (Signed)
  Anesthesia Post-op Note  Patient: Donna Jackson  Procedure(s) Performed: Procedure(s): DILATATION & CURETTAGE/HYSTEROSCOPY WITH RESECTOCOPE (N/A)  Anesthesia type:General  Patient location: PACU  Post pain: Pain level controlled  Post assessment: Post-op Vital signs reviewed, Patient's Cardiovascular Status Stable, Respiratory Function Stable, Patent Airway and No signs of Nausea or vomiting  Post vital signs: Reviewed and stable  Last Vitals:  Filed Vitals:   11/23/14 1013  BP: 118/79  Pulse: 51  Temp:   Resp: 17    Level of consciousness: awake, alert  and patient cooperative  Complications: No apparent anesthesia complications

## 2014-11-23 NOTE — Interval H&P Note (Signed)
History and Physical Interval Note:  11/23/2014 8:29 AM  Donna Jackson  has presented today for surgery, with the diagnosis of POST MENOPAUSAL BLEEDING  The various methods of treatment have been discussed with the patient and family. After consideration of risks, benefits and other options for treatment, the patient has consented to hysteroscopy/D&C with NovaSure endometrial ablation  as a surgical intervention .  The patient's history has been reviewed, patient examined, no change in status, stable for surgery.  I have reviewed the patient's chart and labs.  Questions were answered to the patient's satisfaction.     Daphine DeutscherMartin A Erandi Lemma

## 2014-11-23 NOTE — Anesthesia Preprocedure Evaluation (Signed)
Anesthesia Evaluation  Patient identified by MRN, date of birth, ID band Patient awake    Reviewed: Allergy & Precautions, NPO status , Patient's Chart, lab work & pertinent test results  Airway Mallampati: I  TM Distance: >3 FB Neck ROM: Limited    Dental  (+) Teeth Intact   Pulmonary    Pulmonary exam normal        Cardiovascular Exercise Tolerance: Good (-) hypertensionNormal cardiovascular exam  She has Lupus and Raynaud's syndrome. She does not have PVD.   Neuro/Psych    GI/Hepatic   Endo/Other    Renal/GU Hx of stones.     Musculoskeletal  (+) Arthritis , She has Lupus.   Abdominal Normal abdominal exam  (+)   Peds  Hematology   Anesthesia Other Findings   Reproductive/Obstetrics                             Anesthesia Physical Anesthesia Plan  ASA: III  Anesthesia Plan: General   Post-op Pain Management:    Induction: Intravenous  Airway Management Planned: LMA  Additional Equipment:   Intra-op Plan:   Post-operative Plan: Extubation in OR  Informed Consent: I have reviewed the patients History and Physical, chart, labs and discussed the procedure including the risks, benefits and alternatives for the proposed anesthesia with the patient or authorized representative who has indicated his/her understanding and acceptance.     Plan Discussed with: CRNA  Anesthesia Plan Comments: (Slender 44 yo who is post-menopausal and has Lupus.)        Anesthesia Quick Evaluation

## 2014-11-23 NOTE — Op Note (Signed)
OPERATIVE NOTE  Date of surgery: 11/23/2014  Preoperative diagnosis:  1. Postmenopausal bleeding Postoperative diagnosis:  1. Postmenopausal bleeding Operative procedure: 1. Hysteroscopy 2. Dilation and curettage of endometrium  Surgeon: Dr. Beatris Sie Francesco Assistant: Doreene NestElena Klaus, PA-S  Anesthesia: Gen.   Findings: Pelvis: Gynecoid pelvis; good support with minimal cervical descent with tenaculum traction; if hysterectomy is needed, LAVH will be recommended. Endometrial cavity with shaggy endometrium and no lesions. Narrow width with inability to perform NovaSure endometrial ablation Uterus: sounded to 7.5 7.5 cm Normal appearing endometrial cavity without gross lesions.  Description of procedure Patient was brought to the operating room where she was placed in supine position. General anesthesia was induced without difficulty using the LMA technique. The patient was placed in dorsal lithotomy position using candy cane stirrups. A betadine perineal intravaginal prep and drape was performed in standard fashion. Weighted speculum was placed in the vagina. Single-tooth tenaculum was placed on the anterior cervix. Uterus was sounded to 7.5 cm. Hegar dilators were used up to an 8 JamaicaFrench caliber to dilate the endocervical canal. The [facility] hysteroscope using lactated Ringer's as irrigant was used for the hysteroscopy. The above-noted findings were photo documented. The hysteroscope removed. The smooth and serrated curettes were then used to curettage the endometrial cavity with production of average tissue. Stone polyp forceps were used to grasp residual tissue left behind. The NovaSure endometrial ablation was then attempted. The uterus was sounded to 7.5 cm; cervical length was 3.5 cm; the uterine width was less than 1.5 cm, therefore, the NovaSure ablation could not the completed. The NovaSure instrument was removed. Repeat hysteroscopy was performed and demonstrated excellent sampling. Procedure was  then terminated with all instrumentation being removed from the vagina. The patient was then awakened mobilized and taken to the recovery room in satisfactory condition.  IV fluids: 500 mL. Urine output: 500 mL. EBL: 20 mL.  Instruments, needles, and sponge counts were verified as correct.  Herold HarmsMartin A Defrancesco, MD 11/23/2014

## 2014-11-23 NOTE — Progress Notes (Signed)
Pt advises postmenopausal - Dr Dimple Caseyice in to see pt, discussed with patient, and upreg order cancelled as instructed by Dr Dimple Caseyice

## 2014-11-24 LAB — SURGICAL PATHOLOGY

## 2014-12-01 ENCOUNTER — Encounter: Payer: Self-pay | Admitting: Obstetrics and Gynecology

## 2014-12-01 ENCOUNTER — Ambulatory Visit (INDEPENDENT_AMBULATORY_CARE_PROVIDER_SITE_OTHER): Payer: BLUE CROSS/BLUE SHIELD | Admitting: Obstetrics and Gynecology

## 2014-12-01 VITALS — BP 159/87 | HR 75 | Ht 64.0 in | Wt 119.5 lb

## 2014-12-01 DIAGNOSIS — R030 Elevated blood-pressure reading, without diagnosis of hypertension: Secondary | ICD-10-CM

## 2014-12-01 DIAGNOSIS — N950 Postmenopausal bleeding: Secondary | ICD-10-CM

## 2014-12-01 DIAGNOSIS — IMO0001 Reserved for inherently not codable concepts without codable children: Secondary | ICD-10-CM

## 2014-12-01 DIAGNOSIS — Z09 Encounter for follow-up examination after completed treatment for conditions other than malignant neoplasm: Secondary | ICD-10-CM

## 2014-12-01 NOTE — Patient Instructions (Signed)
1.  Menstrual calendar, monitoring 2.  Blood pressure diary. 3.  Return in 3 months for follow-up on abnormal uterine bleeding. 4.  Reviewed blood pressure diary with Dr. Lavenia AtlasWally Kernodle at rheumatology appointment

## 2014-12-01 NOTE — Progress Notes (Signed)
Patient ID: Donna DachSusan P Jackson, female   DOB: [birth year], 44 y.o.   MRN: [id number] 1 week post op  hysteroscopy and d&c ablation cx Only used pain meds first few days Spotting only  Chief complaint: 1.  One week postop check. 2.  Status post hysteroscopy/D&C. 3.  Endometrial ablation, discontinued due to technical difficulties.  Patient is doing well postop.  Bowel bladder function are normal.  She is having minimal spotting. Options of management for regulating her abnormal uterine bleeding were discussed.  Options included continuous HRT therapy versus hysterectomy versus nothing.  At this time, the patient would like to just monitor her bleeding with a menstrual calendar car.  She will return to see me in 3 months for reassessment.  Should she develop heavy bleeding, she may wish to proceed with hysterectomy, likely through an LAVH or Pfannenstiel open hysterectomy procedure.  OBJECTIVE: BP 159/87 mmHg  Pulse 75  Ht 5\' 4"  (1.626 m)  Wt 119 lb 8 oz (54.205 kg)  BMI 20.50 kg/m2  LMP 09/21/2014   IMPRESSION: 1.  Normal one-week postop check status post hysteroscopy, D&C. 2.  Elevated blood pressure without diagnosis of hypertension.  Plan: 1.  Blood-pressure diary 2.  Follow up with Dr. Lavenia AtlasWally Kernodle in rheumatology and review blood pressures with him. 3.  Return in 3 months for follow-up  Herold HarmsMartin A Defrancesco, MD  Note: This dictation was prepared with Dragon dictation along with smaller phrase technology. Any transcriptional errors that result from this process are unintentional.

## 2015-02-10 ENCOUNTER — Other Ambulatory Visit: Payer: Self-pay

## 2015-02-10 MED ORDER — CONJ ESTROG-MEDROXYPROGEST ACE 0.625-2.5 MG PO TABS: 1.0000 | ORAL_TABLET | Freq: Every day | ORAL | Status: DC

## 2015-02-10 NOTE — Telephone Encounter (Signed)
rx written by DC per mad.

## 2015-03-03 ENCOUNTER — Encounter: Payer: Self-pay | Admitting: Obstetrics and Gynecology

## 2015-03-03 ENCOUNTER — Ambulatory Visit (INDEPENDENT_AMBULATORY_CARE_PROVIDER_SITE_OTHER): Payer: BLUE CROSS/BLUE SHIELD | Admitting: Obstetrics and Gynecology

## 2015-03-03 VITALS — BP 166/83 | HR 74 | Ht 64.0 in | Wt 123.7 lb

## 2015-03-03 DIAGNOSIS — I10 Essential (primary) hypertension: Secondary | ICD-10-CM | POA: Diagnosis not present

## 2015-03-03 DIAGNOSIS — G4700 Insomnia, unspecified: Secondary | ICD-10-CM

## 2015-03-03 DIAGNOSIS — N938 Other specified abnormal uterine and vaginal bleeding: Secondary | ICD-10-CM

## 2015-03-03 DIAGNOSIS — F419 Anxiety disorder, unspecified: Secondary | ICD-10-CM | POA: Diagnosis not present

## 2015-03-03 MED ORDER — CONJ ESTROG-MEDROXYPROGEST ACE 0.625-2.5 MG PO TABS: 1.0000 | ORAL_TABLET | Freq: Every day | ORAL | Status: DC

## 2015-03-03 NOTE — Patient Instructions (Signed)
1.  Continue Prempro daily 2.  Continue menstrual calendar, monitoring 3.  Referral to [facility] internal medicine for hypertension workup. 4.  Return in May for annual exam

## 2015-03-03 NOTE — Progress Notes (Signed)
Chief complaint: 1.  Abnormal uterine bleeding. 2.  Hypertension. 3.  Anxiety and insomnia.  Donna Jackson presents today for follow-up on above issues. ABNORMAL UTERINE BLEEDING: Since D&C, patient has been placed on Prempro and has not had any further abnormal uterine bleeding.  Previously noted vasomotor symptoms are markedly diminished. HYPERTENSION: Donna Jackson has persistent blood pressure elevations and she is encouraged to proceed with [facility], cardiology evaluation for hypertension.  She does not have any acute chest pain, shortness of breath, headaches, etc. ANXIETY and INSOMNIA: Patient has had a stressful year with having close relatives passed away and she is currently test with helping care for her aging father.  She did have some Xanax from previous provider to help with some anxiety at the time of her mom's death.  She is taking a rare half tablet.  Refill request is made today; I have encouraged her to not rely on the Xanax.  I have offered her an anxiolytic medication such as Zoloft, but she declines at this time.  Pros and cons of long-term benzodiazepine use were discussed.  She is comfortable with no prescription refill today.  OBJECTIVE: BP 166/83 mmHg  Pulse 74  Ht  (1.626 m)  Wt 123 lb 11.2 oz (56.11 kg)  BMI 21.22 kg/m2  LMP 09/21/2014 Pleasant, well-appearing white female in no acute distress. Back: No CVA tenderness. Abdomen: Soft, nontender, without organomegaly. Pelvic exam: External genitalia-normal BUS-normal. Vagina-normal Cervix-no lesions. Uterus-midplane, normal size and shape, nontender, mobile. Adnexa-nonpalpable, nontender.  ASSESSMENT: 1.  Abnormal uterine bleeding, status post hysteroscopy/D&C with start of Prempro, HRT therapy-no further abnormal uterine bleeding; vasomotor symptoms markedly reduced. 2.  Hypertension, needs evaluation and management.  Due to the patient's connective tissue disorder and previous intolerance to  antihypertensive medication prescribed by Dr. Lavenia Atlas, I am encouraging her to follow-up with [facility] internal medicine for hypertension workup and management. 3.  Anxiety and insomnia, anxiolytic declined at this time.  PLAN: 1.  Continue with Prempro. 2.  Monitor for abnormal uterine bleeding with menstrual calendar, monitoring 3.  [facility] internal medicine referral. 4.  Follow up as needed for worsening anxiety or insomnia symptoms.  Ambien is available for insomnia.  SSRI.  Anxiolytic therapy is declined.  A total of 15 minutes were spent face-to-face with the patient during this encounter and over half of that time dealt with counseling and coordination of care.  Herold Harms, MD  Note: This dictation was prepared with Dragon dictation along with smaller phrase technology. Any transcriptional errors that result from this process are unintentional.

## 2015-03-05 ENCOUNTER — Ambulatory Visit (INDEPENDENT_AMBULATORY_CARE_PROVIDER_SITE_OTHER): Payer: BLUE CROSS/BLUE SHIELD | Admitting: Family Medicine

## 2015-03-05 ENCOUNTER — Encounter: Payer: Self-pay | Admitting: Family Medicine

## 2015-03-05 VITALS — BP 136/74 | HR 65 | Temp 97.9°F | Ht 64.0 in | Wt 126.2 lb

## 2015-03-05 DIAGNOSIS — Z13220 Encounter for screening for lipoid disorders: Secondary | ICD-10-CM | POA: Diagnosis not present

## 2015-03-05 DIAGNOSIS — R030 Elevated blood-pressure reading, without diagnosis of hypertension: Secondary | ICD-10-CM | POA: Diagnosis not present

## 2015-03-05 DIAGNOSIS — F419 Anxiety disorder, unspecified: Secondary | ICD-10-CM | POA: Diagnosis not present

## 2015-03-05 DIAGNOSIS — IMO0001 Reserved for inherently not codable concepts without codable children: Secondary | ICD-10-CM

## 2015-03-05 DIAGNOSIS — Z0000 Encounter for general adult medical examination without abnormal findings: Secondary | ICD-10-CM

## 2015-03-05 DIAGNOSIS — Z130 Encounter for screening for diseases of the blood and blood-forming organs and certain disorders involving the immune mechanism: Secondary | ICD-10-CM | POA: Diagnosis not present

## 2015-03-05 LAB — CBC WITH DIFFERENTIAL/PLATELET
Basophils Absolute: 0.1 10*3/uL (ref 0.0–0.1)
Basophils Relative: 1 % (ref 0–1)
EOS ABS: 0.2 10*3/uL (ref 0.0–0.7)
EOS PCT: 3 % (ref 0–5)
HCT: 40.8 % (ref 36.0–46.0)
Hemoglobin: 13.9 g/dL (ref 12.0–15.0)
LYMPHS ABS: 2.2 10*3/uL (ref 0.7–4.0)
Lymphocytes Relative: 38 % (ref 12–46)
MCH: 32.4 pg (ref 26.0–34.0)
MCHC: 34.1 g/dL (ref 30.0–36.0)
MCV: 95.1 fL (ref 78.0–100.0)
MONOS PCT: 12 % (ref 3–12)
MPV: 10.6 fL (ref 8.6–12.4)
Monocytes Absolute: 0.7 10*3/uL (ref 0.1–1.0)
Neutro Abs: 2.7 10*3/uL (ref 1.7–7.7)
Neutrophils Relative %: 46 % (ref 43–77)
PLATELETS: 243 10*3/uL (ref 150–400)
RBC: 4.29 MIL/uL (ref 3.87–5.11)
RDW: 12.3 % (ref 11.5–15.5)
WBC: 5.9 10*3/uL (ref 4.0–10.5)

## 2015-03-05 MED ORDER — ESCITALOPRAM OXALATE 10 MG PO TABS: 10.0000 mg | ORAL_TABLET | Freq: Every day | ORAL | Status: DC

## 2015-03-05 NOTE — Assessment & Plan Note (Signed)
Tetanus and flu up to date. Pap smear and mammogram up to date.  Screening labs today.

## 2015-03-05 NOTE — Progress Notes (Signed)
Subjective:  Patient ID: Donna Jackson, female    DOB: September 27, 1970  Age: 45 y.o. MRN: [id number]  CC: Established care; Elevated BP; Anxiety  HPI Donna Jackson is a 45 y.o. female presents to the clinic today to establish care. She has additional concerns as outlined below.  Preventative Healthcare  Pap smear: Up to date;   Mammogram: Up to date.   Colonoscopy: N/A.  Immunizations  Tetanus - Reports she has had in the last 10 years.   Pneumococcal - N/A.  Flu - Received flu vaccine this year.  Labs: Screening labs today.   Exercise: Regular exercise.   Alcohol use: See below.  Smoking/tobacco use: No.  Elevated BP  Has been elevated on multiple occasions at [facility] office.  No associated symptoms - chest pain, SOB,   She does note recent stressors (see below).  No history of HTN.  Anxiety  Longstanding anxiety but worsened as of late (after the death of her mother).  She states that she worries about her father.  She uses Xanax occasionally for her symptoms.  PMH, Surgical Hx, Family Hx, Social History reviewed and updated as below.  Past Medical History  Diagnosis Date  . Kidney stones   . Raynauds syndrome   . DUB (dysfunctional uterine bleeding)   . Hypertension   . Anxiety   . Connective tissue disease ([place])   . Chicken pox    Past Surgical History  Procedure Laterality Date  . Cesarean section  1997  . Dilation and curettage of uterus  1996  . Tonsillectomy and adenoidectomy  1982  . Dilatation & currettage/hysteroscopy with resectocope N/A 11/23/2014    Procedure: DILATATION & CURETTAGE/HYSTEROSCOPY WITH RESECTOCOPE;  Surgeon: Brayton Mars, MD;  Location: [facility] ORS;  Service: Gynecology;  Laterality: N/A;   Family History  Problem Relation Age of Onset  . Hypertension Mother   . Heart disease Father   . Hypertension Father   . Diabetes Maternal Grandmother    Social History  Substance Use Topics  . Smoking status: Never  Smoker   . Smokeless tobacco: Never Used  . Alcohol Use: Yes     Comment: occas    Review of Systems  HENT: Positive for sore throat.   Psychiatric/Behavioral:       Anxiety, Stress.  All other systems reviewed and are negative.   Objective:   Today's Vitals: BP 136/74 mmHg  Pulse 65  Temp(Src) 97.9 F (36.6 C) (Oral)  Ht _0  (1.626 m)  Wt 126 lb 4 oz (57.267 kg)  BMI 21.66 kg/m2  SpO2 98%  LMP 09/21/2014  Physical Exam  Constitutional: She is oriented to person, place, and time. She appears well-developed and well-nourished. No distress.  HENT:  Head: Normocephalic and atraumatic.  Nose: Nose normal.  Mouth/Throat: Oropharynx is clear and moist. No oropharyngeal exudate.  Normal TM's bilaterally.   Eyes: Conjunctivae are normal. No scleral icterus.  Neck: Neck supple. No thyromegaly present.  Cardiovascular: Normal rate and regular rhythm.   No murmur heard. Pulmonary/Chest: Effort normal and breath sounds normal. She has no wheezes. She has no rales.  Abdominal: Soft. She exhibits no distension. There is no tenderness. There is no rebound and no guarding.  Musculoskeletal: Normal range of motion. She exhibits no edema.  Lymphadenopathy:    She has no cervical adenopathy.  Neurological: She is alert and oriented to person, place, and time.  Skin: Skin is warm and dry. No rash noted.  Psychiatric: She has  a normal mood and affect.  Vitals reviewed.  Assessment & Plan:   Problem List Items Addressed This Visit    Anxiety    After discussion, starting patient on Lexapro (patient prefers this in lieu of Xanax).      Relevant Medications   escitalopram (LEXAPRO) 10 MG tablet   Other Relevant Orders   CBC with Differential/Platelet   Comp Met (CMET)   Lipid panel   Elevated blood pressure    BP's from recent office visits reviewed. BP in prehypertensive today after repeat.  Holding off on BP medication at this time. Patient to keep daily log of BP's and  return for nurse visit in 2 weeks. ? White coat HTN; May need 24 hour BP monitoring.      Relevant Orders   CBC with Differential/Platelet   Comp Met (CMET)   Lipid panel   Preventative health care - Primary    Tetanus and flu up to date. Pap smear and mammogram up to date.  Screening labs today.       Other Visit Diagnoses    Screening for deficiency anemia        Relevant Orders    CBC with Differential/Platelet    Comp Met (CMET)    Lipid panel    Screening, lipid        Relevant Orders    CBC with Differential/Platelet    Comp Met (CMET)    Lipid panel       Outpatient Encounter Prescriptions as of 03/05/2015  Medication Sig  . ALPRAZolam (XANAX) 0.5 MG tablet Take 0.5 mg by mouth every 6 (six) hours as needed for anxiety.  Marland Kitchen estrogen, conjugated,-medroxyprogesterone (PREMPRO) 0.625-2.5 MG tablet Take 1 tablet by mouth daily.  . hydroxychloroquine (PLAQUENIL) 200 MG tablet Take 200 mg by mouth every other day.   . Multiple Vitamins-Minerals (MULTIVITAMIN WITH MINERALS) tablet Take 1 tablet by mouth daily.  Marland Kitchen zolpidem (AMBIEN) 5 MG tablet Take 5 mg by mouth at bedtime as needed for sleep.  Marland Kitchen escitalopram (LEXAPRO) 10 MG tablet Take 1 tablet (10 mg total) by mouth daily.   No facility-administered encounter medications on file as of 03/05/2015.    Follow-up: Return in about 2 weeks (around 03/19/2015) for BP check - Nurse visit.  [place]

## 2015-03-05 NOTE — Patient Instructions (Signed)
It was nice to see you today.  Take the lexapro daily.  Discuss hormone therapy with Gyn as we may be able to not use it.  Keep a log of your BP.  Follow up:  Return in about 2 weeks (around 03/19/2015) for BP check - Nurse visit.  Take care  Dr. Adriana Simas

## 2015-03-05 NOTE — Assessment & Plan Note (Signed)
BP's from recent office visits reviewed. BP in prehypertensive today after repeat.  Holding off on BP medication at this time. Patient to keep daily log of BP's and return for nurse visit in 2 weeks. ? White coat HTN; May need 24 hour BP monitoring.

## 2015-03-05 NOTE — Assessment & Plan Note (Signed)
After discussion, starting patient on Lexapro (patient prefers this in lieu of Xanax).

## 2015-03-06 LAB — LIPID PANEL
CHOL/HDL RATIO: 1.9 ratio (ref <=5.0)
CHOLESTEROL: 131 mg/dL (ref 125–200)
HDL: 69 mg/dL (ref >=46)
LDL Cholesterol: 52 mg/dL (ref <130)
TRIGLYCERIDES: 50 mg/dL (ref <150)
VLDL: 10 mg/dL (ref <30)

## 2015-03-06 LAB — COMPREHENSIVE METABOLIC PANEL
ALT: 11 U/L (ref 6–29)
AST: 22 U/L (ref 10–35)
Albumin: 4.3 g/dL (ref 3.6–5.1)
Alkaline Phosphatase: 66 U/L (ref 33–115)
BUN: 13 mg/dL (ref 7–25)
CHLORIDE: 101 mmol/L (ref 98–110)
CO2: 27 mmol/L (ref 20–31)
Calcium: 9.3 mg/dL (ref 8.6–10.2)
Creat: 0.75 mg/dL (ref 0.50–1.10)
Glucose, Bld: 60 mg/dL — ABNORMAL LOW (ref 65–99)
POTASSIUM: 4.7 mmol/L (ref 3.5–5.3)
Sodium: 141 mmol/L (ref 135–146)
TOTAL PROTEIN: 6.8 g/dL (ref 6.1–8.1)
Total Bilirubin: 0.3 mg/dL (ref 0.2–1.2)

## 2015-03-18 ENCOUNTER — Ambulatory Visit (INDEPENDENT_AMBULATORY_CARE_PROVIDER_SITE_OTHER): Payer: BLUE CROSS/BLUE SHIELD

## 2015-03-18 VITALS — BP 136/80 | HR 57 | Resp 18

## 2015-03-18 DIAGNOSIS — R030 Elevated blood-pressure reading, without diagnosis of hypertension: Secondary | ICD-10-CM

## 2015-03-18 DIAGNOSIS — IMO0001 Reserved for inherently not codable concepts without codable children: Secondary | ICD-10-CM

## 2015-03-18 NOTE — Progress Notes (Signed)
Patient came in for BP check.  Blood pressure was high at prior OV, Provider wanted recheck.  Patient not on BP meds. Blood pressure checked in bilateral upper extremities.  See vitals for details.    Please advise.

## 2015-05-26 ENCOUNTER — Encounter: Payer: Self-pay | Admitting: *Deleted

## 2015-05-26 ENCOUNTER — Emergency Department
Admission: EM | Admit: 2015-05-26 | Discharge: 2015-05-26 | Disposition: A | Discharge status: Home / Self Care | Payer: BLUE CROSS/BLUE SHIELD | Attending: Emergency Medicine | Admitting: Emergency Medicine

## 2015-05-26 DIAGNOSIS — I7300 Raynaud's syndrome without gangrene: Secondary | ICD-10-CM | POA: Insufficient documentation

## 2015-05-26 DIAGNOSIS — I10 Essential (primary) hypertension: Secondary | ICD-10-CM | POA: Insufficient documentation

## 2015-05-26 DIAGNOSIS — R230 Cyanosis: Secondary | ICD-10-CM | POA: Diagnosis present

## 2015-05-26 LAB — CBC WITH DIFFERENTIAL/PLATELET
BASOS ABS: 0.1 10*3/uL (ref 0–0.1)
Basophils Relative: 1 %
EOS ABS: 0.1 10*3/uL (ref 0–0.7)
EOS PCT: 2 %
HCT: 41.6 % (ref 35.0–47.0)
HEMOGLOBIN: 14.2 g/dL (ref 12.0–16.0)
LYMPHS ABS: 2.6 10*3/uL (ref 1.0–3.6)
Lymphocytes Relative: 43 %
MCH: 32.4 pg (ref 26.0–34.0)
MCHC: 34.1 g/dL (ref 32.0–36.0)
MCV: 95 fL (ref 80.0–100.0)
Monocytes Absolute: 0.7 10*3/uL (ref 0.2–0.9)
Monocytes Relative: 11 %
NEUTROS PCT: 43 %
Neutro Abs: 2.6 10*3/uL (ref 1.4–6.5)
PLATELETS: 221 10*3/uL (ref 150–440)
RBC: 4.38 MIL/uL (ref 3.80–5.20)
RDW: 12.5 % (ref 11.5–14.5)
WBC: 6.1 10*3/uL (ref 3.6–11.0)

## 2015-05-26 LAB — COMPREHENSIVE METABOLIC PANEL
ALT: 16 U/L (ref 14–54)
AST: 25 U/L (ref 15–41)
Albumin: 4.8 g/dL (ref 3.5–5.0)
Alkaline Phosphatase: 76 U/L (ref 38–126)
Anion gap: 8 (ref 5–15)
BILIRUBIN TOTAL: 0.5 mg/dL (ref 0.3–1.2)
BUN: 17 mg/dL (ref 6–20)
CHLORIDE: 108 mmol/L (ref 101–111)
CO2: 27 mmol/L (ref 22–32)
CREATININE: 0.83 mg/dL (ref 0.44–1.00)
Calcium: 9.4 mg/dL (ref 8.9–10.3)
Glucose, Bld: 96 mg/dL (ref 65–99)
POTASSIUM: 3.8 mmol/L (ref 3.5–5.1)
Sodium: 143 mmol/L (ref 135–145)
TOTAL PROTEIN: 7.5 g/dL (ref 6.5–8.1)

## 2015-05-26 NOTE — ED Notes (Signed)
Pt has discoloration to left hand.  Hand cool to touch and cyanotic/mottled.  Pt denies any pain.  Pt states she was feeding her dog tonight when sx began 1 hour ago.  Pt alert.

## 2015-05-26 NOTE — ED Provider Notes (Signed)
CSN: [id number]     Arrival date & time 05/26/15  2126 History   First MD Initiated Contact with Patient 05/26/15 2141     Chief Complaint  Patient presents with  . Hand Problem     (Consider location/radiation/quality/duration/timing/severity/associated sxs/prior Treatment) The history is provided by the patient.  Donna Jackson is a 45 y.o. female hx of lupus, raynauds on plaquenil here with left hand cyanosis, hypertension. Patient was feeding her dog around 1 hour prior to arrival and then suddenly noticed that the left and became cool and cold and turned blue. Denies any numbness to the area or any weakness. Denies any chest pain. She has hx of raynauds but usually the hand turns white and not blue. She didn't contact anything cold at that time. She called EMS, who noticed that she was hypertensive to 170s and sent her for evaluation. Not a smoker, no hx of arterial clots.    Past Medical History  Diagnosis Date  . Kidney stones   . Raynauds syndrome   . DUB (dysfunctional uterine bleeding)   . Hypertension   . Anxiety   . Connective tissue disease (HCC)   . Chicken pox    Past Surgical History  Procedure Laterality Date  . Cesarean section  1997  . Dilation and curettage of uterus  1996  . Tonsillectomy and adenoidectomy  1982  . Dilatation & currettage/hysteroscopy with resectocope N/A 11/23/2014    Procedure: DILATATION & CURETTAGE/HYSTEROSCOPY WITH RESECTOCOPE;  Surgeon: Herold Harms, MD;  Location: [facility] ORS;  Service: Gynecology;  Laterality: N/A;   Family History  Problem Relation Age of Onset  . Hypertension Mother   . Heart disease Father   . Hypertension Father   . Diabetes Maternal Grandmother    Social History  Substance Use Topics  . Smoking status: Never Smoker   . Smokeless tobacco: Never Used  . Alcohol Use: Yes     Comment: occas   OB History    Gravida Para Term Preterm AB TAB SAB Ectopic Multiple Living   Review  of Systems  Musculoskeletal:       L hand cyanosis   All other systems reviewed and are negative.     Allergies  Celecoxib; Sulfa antibiotics; Amlodipine; and Levofloxacin  Home Medications   Prior to Admission medications   Medication Sig Start Date End Date Taking? Authorizing Provider  ALPRAZolam Prudy Feeler) 0.5 MG tablet Take 0.5 mg by mouth every 6 (six) hours as needed for anxiety.    Historical Provider, MD  escitalopram (LEXAPRO) 10 MG tablet Take 1 tablet (10 mg total) by mouth daily. 03/05/15   Tommie Sams, DO  estrogen, conjugated,-medroxyprogesterone (PREMPRO) 0.625-2.5 MG tablet Take 1 tablet by mouth daily. 03/03/15   Prentice Docker Defrancesco, MD  hydroxychloroquine (PLAQUENIL) 200 MG tablet Take 200 mg by mouth every other day.  01/06/14   Historical Provider, MD  Multiple Vitamins-Minerals (MULTIVITAMIN WITH MINERALS) tablet Take 1 tablet by mouth daily.    Historical Provider, MD  zolpidem (AMBIEN) 5 MG tablet Take 5 mg by mouth at bedtime as needed for sleep.    Historical Provider, MD   BP 123/84 mmHg  Pulse 70  Temp(Src) 97.8 F (36.6 C) (Oral)  Ht  (1.626 m)  Wt 122 lb (55.339 kg)  BMI 20.93 kg/m2  SpO2 100%  LMP 09/21/2014 Physical Exam  Constitutional: She is oriented to person, place,  and time.  Slightly anxious   HENT:  Head: Normocephalic.  Eyes: Conjunctivae are normal. Pupils are equal, round, and reactive to light.  Neck: Normal range of motion. Neck supple.  Cardiovascular: Normal rate, regular rhythm and normal heart sounds.   Pulmonary/Chest: Effort normal and breath sounds normal. No respiratory distress. She has no wheezes. She has no rales.  Abdominal: Soft. Bowel sounds are normal. She exhibits no distension. There is no tenderness. There is no rebound.  Musculoskeletal:  Both hands slightly cold but has good capillary refill. Good radial pulses bilaterally. Nl sensation and hand grasp.   Neurological: She is alert and oriented to person,  place, and time.  Skin: Skin is warm.  Psychiatric: She has a normal mood and affect. Her behavior is normal. Judgment and thought content normal.  Nursing note and vitals reviewed.   ED Course  Procedures (including critical care time) Labs Review Labs Reviewed  COMPREHENSIVE METABOLIC PANEL  CBC WITH DIFFERENTIAL/PLATELET    Imaging Review No results found. I have personally reviewed and evaluated these images and lab results as part of my medical decision-making.   EKG Interpretation None      MDM   Final diagnoses:  None    Donna Jackson is a 45 y.o. female here with L hand cyanosis. Doesn't appear cyanotic currently and she states that color has returned to normal. Her hands are cold at baseline. I suspect raynauds phenomenon and doubt arterial clot. She has good radial pulses and neurovascular intact. Will get labs, will have her soak her hands in warm water.   11:09 PM Labs unremarkable. Hands back to normal. Will dc home. Told her to call rheumatology in the morning.     Richardean Canalavid H Yao, MD 05/26/15 [phone]

## 2015-05-26 NOTE — Discharge Instructions (Signed)
Continue your plaquenil.   Call your rheumatologist tomorrow.   Return to ER if you have worse hand cyanosis or pain or numbness or weakness.

## 2015-06-30 ENCOUNTER — Encounter: Payer: BLUE CROSS/BLUE SHIELD | Admitting: Obstetrics and Gynecology

## 2015-07-07 ENCOUNTER — Other Ambulatory Visit: Payer: Self-pay | Admitting: Obstetrics and Gynecology

## 2015-07-07 DIAGNOSIS — Z1231 Encounter for screening mammogram for malignant neoplasm of breast: Secondary | ICD-10-CM

## 2015-07-08 ENCOUNTER — Encounter: Payer: BLUE CROSS/BLUE SHIELD | Admitting: Obstetrics and Gynecology

## 2015-07-13 ENCOUNTER — Other Ambulatory Visit: Payer: Self-pay | Admitting: Obstetrics and Gynecology

## 2015-07-14 ENCOUNTER — Ambulatory Visit
Admission: RE | Admit: 2015-07-14 | Discharge: 2015-07-14 | Disposition: A | Discharge status: Home / Self Care | Payer: BLUE CROSS/BLUE SHIELD | Source: Ambulatory Visit | Attending: Obstetrics and Gynecology | Admitting: Obstetrics and Gynecology

## 2015-07-14 ENCOUNTER — Other Ambulatory Visit: Payer: Self-pay | Admitting: Obstetrics and Gynecology

## 2015-07-14 DIAGNOSIS — Z1231 Encounter for screening mammogram for malignant neoplasm of breast: Secondary | ICD-10-CM | POA: Diagnosis not present

## 2015-08-05 ENCOUNTER — Encounter: Payer: BLUE CROSS/BLUE SHIELD | Admitting: Obstetrics and Gynecology

## 2015-11-04 ENCOUNTER — Ambulatory Visit (INDEPENDENT_AMBULATORY_CARE_PROVIDER_SITE_OTHER): Payer: BLUE CROSS/BLUE SHIELD | Admitting: Obstetrics and Gynecology

## 2015-11-04 ENCOUNTER — Encounter: Payer: Self-pay | Admitting: Obstetrics and Gynecology

## 2015-11-04 VITALS — BP 144/100 | HR 98 | Ht 64.0 in | Wt 123.1 lb

## 2015-11-04 DIAGNOSIS — Z01419 Encounter for gynecological examination (general) (routine) without abnormal findings: Secondary | ICD-10-CM | POA: Diagnosis not present

## 2015-11-04 MED ORDER — ESCITALOPRAM OXALATE 10 MG PO TABS: 10.0000 mg | ORAL_TABLET | Freq: Every day | ORAL | 4 refills | Status: DC

## 2015-11-04 MED ORDER — ALPRAZOLAM 0.5 MG PO TABS: 0.5000 mg | ORAL_TABLET | Freq: Four times a day (QID) | ORAL | 1 refills | Status: DC | PRN

## 2015-11-04 NOTE — Progress Notes (Signed)
Subjective:   Donna DachSusan P Jackson is a 45 y.o. 802P1011 Caucasian female here for a routine well-woman exam.  Patient's last menstrual period was 09/21/2014.    Current complaints: stressed about caring for aging father. PCP: me       does desire labs  Social History: Sexual: heterosexual Marital Status: married Living situation: with spouse Occupation: unknown occupation Tobacco/alcohol: no tobacco use Illicit drugs: no history of illicit drug use  The following portions of the patient's history were reviewed and updated as appropriate: allergies, current medications, past family history, past medical history, past social history, past surgical history and problem list.  Past Medical History Past Medical History:  Diagnosis Date  . Anxiety   . Chicken pox   . Connective tissue disease (HCC)   . DUB (dysfunctional uterine bleeding)   . Hypertension   . Kidney stones   . Raynauds syndrome     Past Surgical History Past Surgical History:  Procedure Laterality Date  . CESAREAN SECTION  1997  . DILATATION & CURRETTAGE/HYSTEROSCOPY WITH RESECTOCOPE N/A 11/23/2014   Procedure: DILATATION & CURETTAGE/HYSTEROSCOPY WITH RESECTOCOPE;  Surgeon: Herold HarmsMartin A Defrancesco, MD;  Location: [facility] ORS;  Service: Gynecology;  Laterality: N/A;  . DILATION AND CURETTAGE OF UTERUS  1996  . TONSILLECTOMY AND ADENOIDECTOMY  1982    Gynecologic History G2P1011  Patient's last menstrual period was 09/21/2014. Contraception: status post hysterectomy Last Pap: 2015. Results were: normal Last mammogram: 2017. Results were: normal   Obstetric History OB History  Gravida Para Term Preterm AB Living  2 1 1   1 1   SAB TAB Ectopic Multiple Live Births  1       1    # Outcome Date GA Lbr Len/2nd Weight Sex Delivery Anes PTL Lv  2 Term 1997   8 lb 6.4 oz (3.81 kg) M CS-LTranv   LIV  1 SAB 1996              Current Medications Current Outpatient Prescriptions on File Prior to Visit  Medication Sig  Dispense Refill  . hydroxychloroquine (PLAQUENIL) 200 MG tablet Take 200 mg by mouth every other day.     . Multiple Vitamins-Minerals (MULTIVITAMIN WITH MINERALS) tablet Take 1 tablet by mouth daily.    Marland Kitchen. PREMPRO 0.625-2.5 MG tablet TAKE 1 TABLET BY MOUTH DAILY 28 tablet 3  . ALPRAZolam (XANAX) 0.5 MG tablet Take 0.5 mg by mouth every 6 (six) hours as needed for anxiety.    Marland Kitchen. escitalopram (LEXAPRO) 10 MG tablet Take 1 tablet (10 mg total) by mouth daily. (Patient not taking: Reported on 11/04/2015) 90 tablet 0  . zolpidem (AMBIEN) 5 MG tablet Take 5 mg by mouth at bedtime as needed for sleep.     No current facility-administered medications on file prior to visit.     Review of Systems Patient denies any headaches, blurred vision, shortness of breath, chest pain, abdominal pain, problems with bowel movements, urination, or intercourse.  Objective:  BP (!) 144/100   Pulse 98   Ht 5\' 4"  (1.626 m)   Wt 123 lb 1.6 oz (55.8 kg)   LMP 09/21/2014   BMI 21.13 kg/m  Physical Exam  General:  Well developed, well nourished, no acute distress. She is alert and oriented x3. Skin:  Warm and dry Neck:  Midline trachea, no thyromegaly or nodules Cardiovascular: Regular rate and rhythm, no murmur heard Lungs:  Effort normal, all lung fields clear to auscultation bilaterally Breasts:  No dominant palpable mass, retraction,  or nipple discharge Abdomen:  Soft, non tender, no hepatosplenomegaly or masses Pelvic:  External genitalia is normal in appearance.  The vagina is normal in appearance. The cervix is surgically absent.  Thin prep pap is not done. Uterus is surgically absent  No adnexal masses or tenderness noted. Extremities:  No swelling or varicosities noted Psych:  She has a normal mood and affect  Assessment:   Healthy well-woman exam S/p hysterectomy Anxiety Elevated BP  Plan:  Will start Lexapro now, RTC 5 weeks fo BP and med check F/U 1 year for AE, or sooner if needed Mammogram  ordered or sooner if problems   Melody Suzan Nailer, CNM

## 2015-11-04 NOTE — Patient Instructions (Signed)
  Place annual gynecologic exam patient instructions here.  Thank you for enrolling in MyChart. Please follow the instructions below to securely access your online medical record. MyChart allows you to send messages to your doctor, view your test results, manage appointments, and more.   How Do I Sign Up? 1. In your Internet browser, go to [employer] and enter [email or web address]. 2. Click on the Sign Up Now link in the Sign In box. You will see the New Member Sign Up page. 3. Enter your MyChart Access Code exactly as it appears below. You will not need to use this code after you've completed the sign-up process. If you do not sign up before the expiration date, you must request a new code.  MyChart Access Code: [email or web address] Expires: 01/03/2016  3:43 PM  4. Enter your Social Security Number ([id number]) and Date of Birth (mm/dd/yyyy) as indicated and click Submit. You will be taken to the next sign-up page. 5. Create a MyChart ID. This will be your MyChart login ID and cannot be changed, so think of one that is secure and easy to remember. 6. Create a MyChart password. You can change your password at any time. 7. Enter your Password Reset Question and Answer. This can be used at a later time if you forget your password.  8. Enter your e-mail address. You will receive e-mail notification when new information is available in MyChart. 9. Click Sign Up. You can now view your medical record.   Additional Information Remember, MyChart is NOT to be used for urgent needs. For medical emergencies, dial 911.

## 2015-11-05 ENCOUNTER — Telehealth: Payer: Self-pay | Admitting: *Deleted

## 2015-11-05 LAB — COMPREHENSIVE METABOLIC PANEL
A/G RATIO: 1.8 (ref 1.2–2.2)
ALBUMIN: 4.7 g/dL (ref 3.5–5.5)
ALK PHOS: 71 IU/L (ref 39–117)
ALT: 9 IU/L (ref 0–32)
AST: 21 IU/L (ref 0–40)
BILIRUBIN TOTAL: 0.2 mg/dL (ref 0.0–1.2)
BUN / CREAT RATIO: 14 (ref 9–23)
BUN: 10 mg/dL (ref 6–24)
CO2: 25 mmol/L (ref 18–29)
CREATININE: 0.69 mg/dL (ref 0.57–1.00)
Calcium: 9.6 mg/dL (ref 8.7–10.2)
Chloride: 100 mmol/L (ref 96–106)
GFR calc Af Amer: 120 mL/min/{1.73_m2} (ref >59)
GFR calc non Af Amer: 104 mL/min/{1.73_m2} (ref >59)
GLOBULIN, TOTAL: 2.6 g/dL (ref 1.5–4.5)
Glucose: 95 mg/dL (ref 65–99)
POTASSIUM: 3.9 mmol/L (ref 3.5–5.2)
SODIUM: 140 mmol/L (ref 134–144)
Total Protein: 7.3 g/dL (ref 6.0–8.5)

## 2015-11-05 LAB — LIPID PANEL
CHOLESTEROL TOTAL: 158 mg/dL (ref 100–199)
Chol/HDL Ratio: 2.3 ratio units (ref 0.0–4.4)
HDL: 69 mg/dL (ref >39)
LDL Calculated: 77 mg/dL (ref 0–99)
TRIGLYCERIDES: 61 mg/dL (ref 0–149)
VLDL CHOLESTEROL CAL: 12 mg/dL (ref 5–40)

## 2015-11-05 LAB — VITAMIN D 25 HYDROXY (VIT D DEFICIENCY, FRACTURES): VIT D 25 HYDROXY: 38.2 ng/mL (ref 30.0–100.0)

## 2015-11-05 NOTE — Telephone Encounter (Signed)
Notified pt of lab results 

## 2015-11-05 NOTE — Telephone Encounter (Signed)
-----   Message from Purcell NailsMelody N Shambley, [place] sent at 11/05/2015 11:58 AM EDT ----- Please let her know labs look good, also remind to sign up for MyChart

## 2015-12-09 ENCOUNTER — Encounter: Payer: Self-pay | Admitting: Obstetrics and Gynecology

## 2015-12-09 ENCOUNTER — Ambulatory Visit (INDEPENDENT_AMBULATORY_CARE_PROVIDER_SITE_OTHER): Payer: BLUE CROSS/BLUE SHIELD | Admitting: Obstetrics and Gynecology

## 2015-12-09 VITALS — BP 132/84 | HR 98 | Ht 64.0 in | Wt 119.6 lb

## 2015-12-09 DIAGNOSIS — F419 Anxiety disorder, unspecified: Secondary | ICD-10-CM | POA: Diagnosis not present

## 2015-12-09 DIAGNOSIS — G479 Sleep disorder, unspecified: Secondary | ICD-10-CM | POA: Diagnosis not present

## 2015-12-09 DIAGNOSIS — Z79899 Other long term (current) drug therapy: Secondary | ICD-10-CM

## 2015-12-09 NOTE — Progress Notes (Signed)
Subjective:     Patient ID: Donna Jackson, female   DOB: 03-16-1970, 45 y.o.   MRN: [id number]  HPI Here to review new start on Lexapro- states she feels less overwhelmed and slightly better. Is not sleeping well unless she takes a xanax.  BP at home checks all normal.  Review of Systems Negative except stated in HPI    Objective:   Physical Exam A&O x4 Well groomed female in no distress Blood pressure 132/84, pulse 98, height 5\' 4"  (1.626 m), weight 119 lb 9.6 oz (54.3 kg), last menstrual period 09/21/2014. PE not indicated    Assessment:     Depression  Sleep disturbance  BP check    Plan:     Will increase lexapro to 20mg  daily, and let me know in 2-3 weeks how she is feeling.  to take xanax at bedtime more frequently as needed. No need for more BP checks unless symptoms appear.  RTC as neeeded  Entire visit spent in counseling.  Zyonna Vardaman [place]ord[place] HillsShambley, CNM

## 2016-02-04 ENCOUNTER — Other Ambulatory Visit: Payer: Self-pay | Admitting: Obstetrics and Gynecology

## 2016-06-13 ENCOUNTER — Other Ambulatory Visit (INDEPENDENT_AMBULATORY_CARE_PROVIDER_SITE_OTHER): Payer: BLUE CROSS/BLUE SHIELD | Admitting: Obstetrics and Gynecology

## 2016-06-13 DIAGNOSIS — R109 Unspecified abdominal pain: Secondary | ICD-10-CM | POA: Diagnosis not present

## 2016-06-13 LAB — POCT URINALYSIS DIPSTICK
Bilirubin, UA: NEGATIVE
Glucose, UA: NEGATIVE
KETONES UA: NEGATIVE
Leukocytes, UA: NEGATIVE
Nitrite, UA: NEGATIVE
PH UA: 7 (ref 5.0–8.0)
PROTEIN UA: NEGATIVE
RBC UA: NEGATIVE
Urobilinogen, UA: 0.2 E.U./dL

## 2016-06-14 ENCOUNTER — Ambulatory Visit (INDEPENDENT_AMBULATORY_CARE_PROVIDER_SITE_OTHER): Payer: BLUE CROSS/BLUE SHIELD | Admitting: Obstetrics and Gynecology

## 2016-06-14 ENCOUNTER — Encounter: Payer: Self-pay | Admitting: Obstetrics and Gynecology

## 2016-06-14 VITALS — BP 140/100 | HR 98 | Ht 64.0 in | Wt 124.9 lb

## 2016-06-14 DIAGNOSIS — Z0000 Encounter for general adult medical examination without abnormal findings: Secondary | ICD-10-CM

## 2016-06-14 DIAGNOSIS — R109 Unspecified abdominal pain: Secondary | ICD-10-CM | POA: Diagnosis not present

## 2016-06-14 NOTE — Progress Notes (Signed)
Subjective:     Patient ID: Calton DachSusan P Behne, female   DOB: 11-Dec-1970, 46 y.o.   MRN: [id number]  HPI Started having low back pain 4 days ago, dull & throbbing, then had left flank pain x 2 days that didn't radiate. Denies blood in urine. States it doesn't feel like a stone or cyst (as she has had then in them both in past). Denies fever. Took tylenol and it helped.   Last renal stone 24 years ago. Last ovarian cyst October 2016.  Review of Systems Negative except stated above in HPI.    Objective:   Physical Exam A&O x4 Well groomed female in no distress Blood pressure (!) 140/100, pulse 98, height 5\' 4"  (1.626 m), weight 124 lb 14.4 oz (56.7 kg), last menstrual period 09/21/2014. Urine yesterday was negative ( sent for cultures) Negative flank pain on exam, kidneys not enlarged and no spine defect noted. Abdomen soft and not tender Pelvic exam: normal external genitalia, vulva, vagina, cervix, uterus and adnexa.     Assessment:     Left flank pain, h/o renal stones Needs mammogram    Plan:     Renal ultrasound ordered, will follow up accordingly MMG ordered as it is due next month. Heat and tylenol use as needed, RTC as needed.  Sanjuanita Condrey WillernieShambley, CNM

## 2016-06-15 ENCOUNTER — Encounter: Payer: BLUE CROSS/BLUE SHIELD | Admitting: Obstetrics and Gynecology

## 2016-06-15 LAB — URINE CULTURE: Organism ID, Bacteria: NO GROWTH

## 2016-06-21 ENCOUNTER — Ambulatory Visit
Admission: RE | Admit: 2016-06-21 | Discharge: 2016-06-21 | Disposition: A | Discharge status: Home / Self Care | Payer: BLUE CROSS/BLUE SHIELD | Source: Ambulatory Visit | Attending: Obstetrics and Gynecology | Admitting: Obstetrics and Gynecology

## 2016-06-21 DIAGNOSIS — R109 Unspecified abdominal pain: Secondary | ICD-10-CM | POA: Diagnosis present

## 2016-06-21 DIAGNOSIS — Z87442 Personal history of urinary calculi: Secondary | ICD-10-CM | POA: Diagnosis present

## 2016-06-22 ENCOUNTER — Telehealth: Payer: Self-pay | Admitting: *Deleted

## 2016-06-22 NOTE — Telephone Encounter (Signed)
-----   Message from Purcell NailsMelody N Shambley, [place] sent at 06/21/2016  5:00 PM EDT ----- Please let her know renal ultrasound was normal

## 2016-06-22 NOTE — Telephone Encounter (Signed)
Notified pt of results 

## 2016-09-19 ENCOUNTER — Other Ambulatory Visit: Payer: Self-pay | Admitting: Obstetrics and Gynecology

## 2016-10-03 ENCOUNTER — Ambulatory Visit
Admission: RE | Admit: 2016-10-03 | Discharge: 2016-10-03 | Disposition: A | Discharge status: Home / Self Care | Payer: BLUE CROSS/BLUE SHIELD | Source: Ambulatory Visit | Attending: Obstetrics and Gynecology | Admitting: Obstetrics and Gynecology

## 2016-10-03 DIAGNOSIS — Z1231 Encounter for screening mammogram for malignant neoplasm of breast: Secondary | ICD-10-CM | POA: Diagnosis not present

## 2016-10-03 DIAGNOSIS — Z0000 Encounter for general adult medical examination without abnormal findings: Secondary | ICD-10-CM

## 2017-01-27 IMAGING — MG MM DIGITAL SCREENING BILATERAL
5 series · 5 of 5 positions shown · non-contrast
Comparison: Previous exam(s).

CLINICAL DATA: Screening.

EXAM:
DIGITAL SCREENING BILATERAL MAMMOGRAM WITH CAD

[L CC]
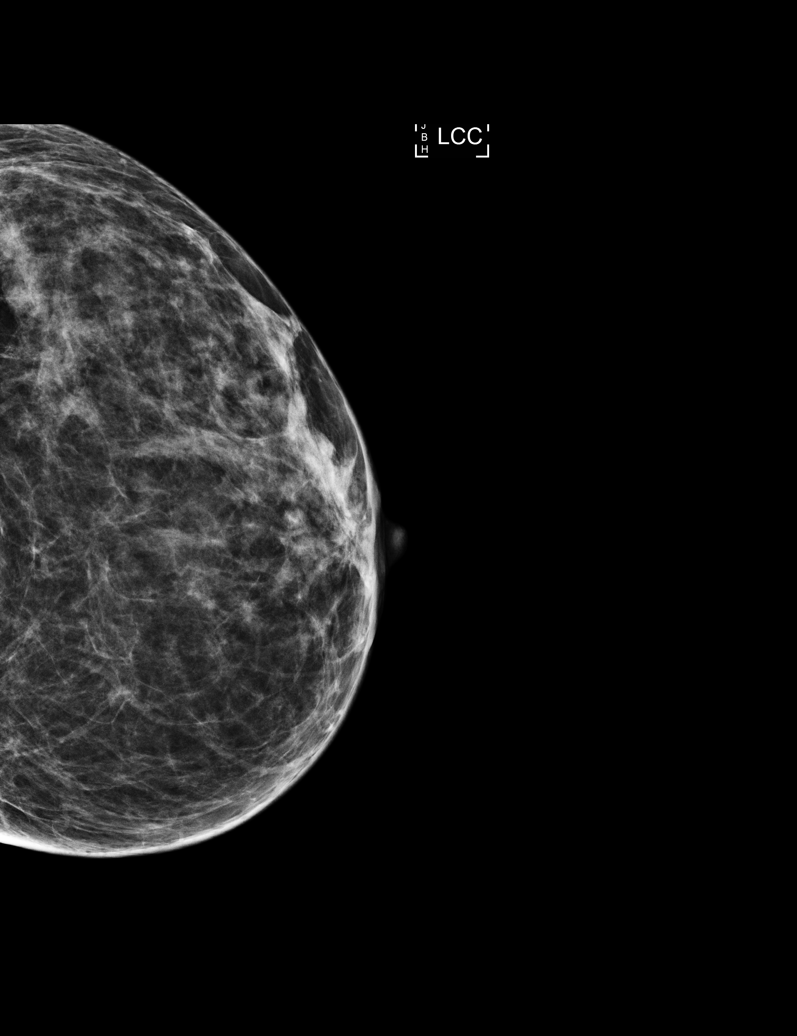

[R MLO (1 of 2)]
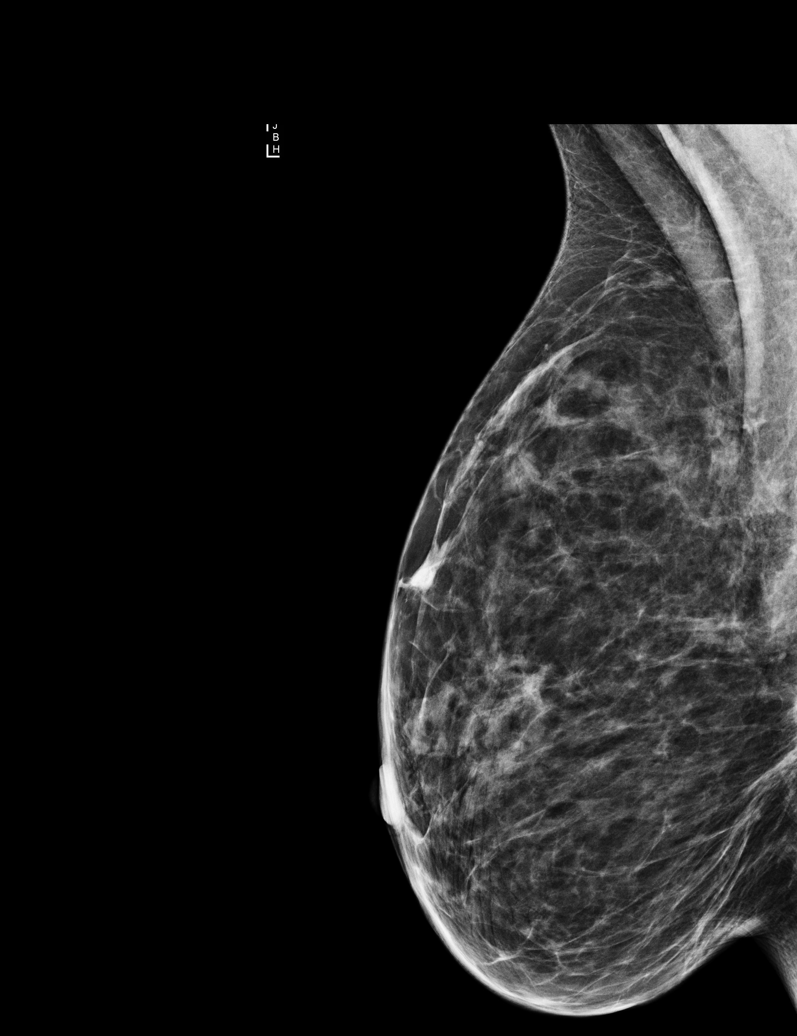

[R MLO (2 of 2)]
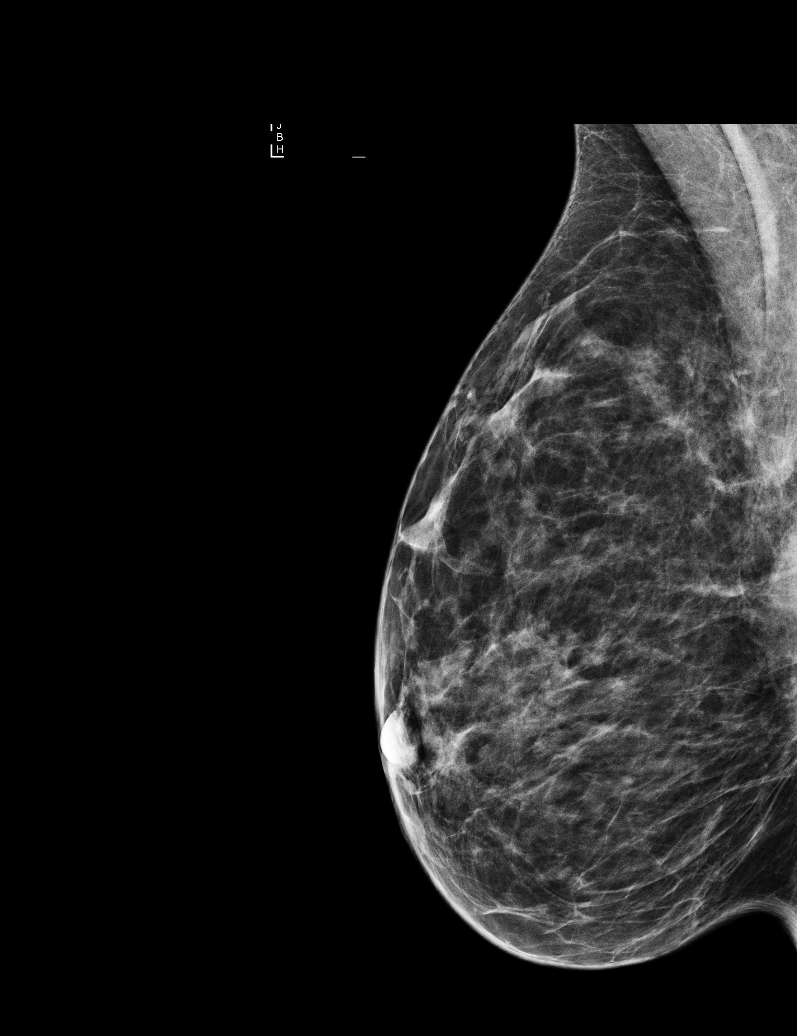

[R CC]
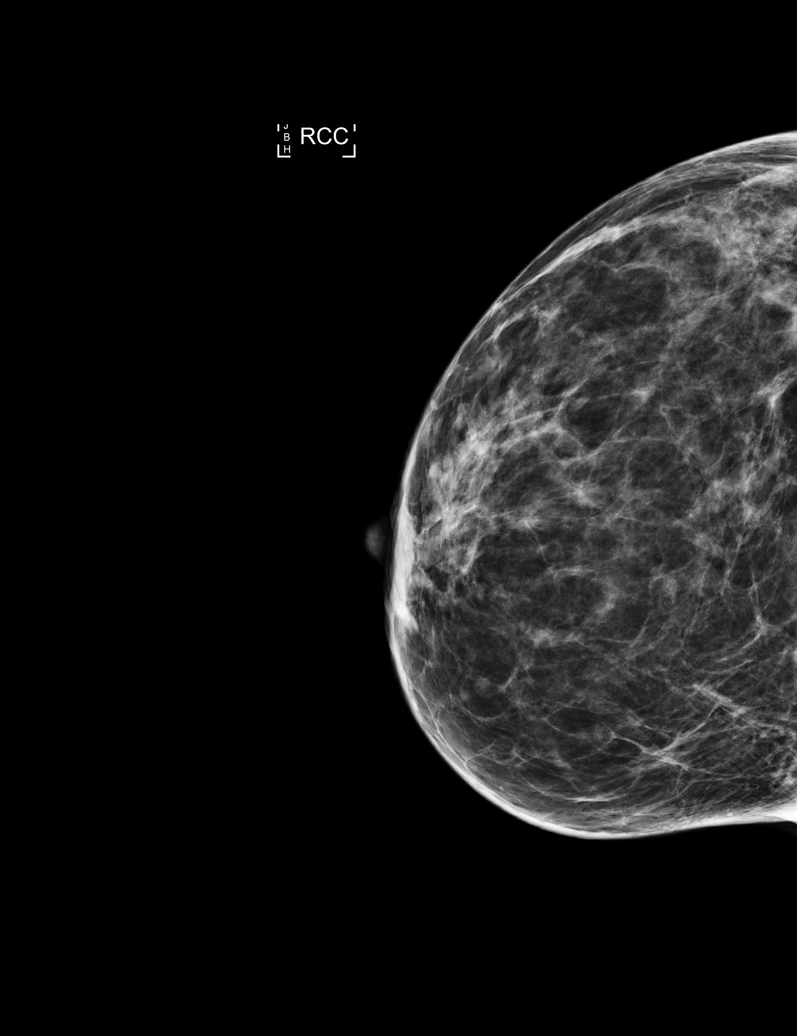

[L MLO]
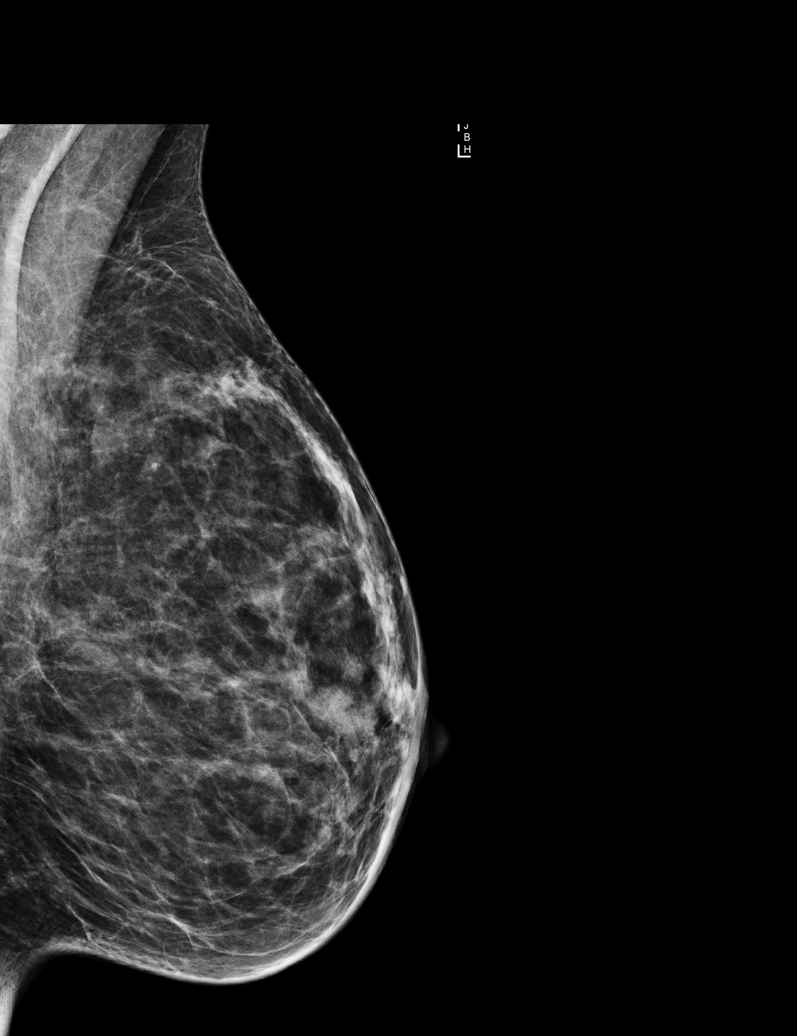

[5 of 5 positions shown; findings below may reference images not displayed]

ACR Breast Density Category b: There are scattered areas of
fibroglandular density.
FINDINGS: There are no findings suspicious for malignancy. Images were
processed with CAD.
IMPRESSION: No mammographic evidence of malignancy. A result letter of this
screening mammogram will be mailed directly to the patient.

RECOMMENDATION:
Screening mammogram in one year. (Code:AS-G-LCT)

BI-RADS CATEGORY  1: Negative.

## 2017-02-21 ENCOUNTER — Other Ambulatory Visit: Payer: Self-pay | Admitting: Obstetrics and Gynecology

## 2017-07-06 ENCOUNTER — Encounter: Payer: Self-pay | Admitting: Obstetrics and Gynecology

## 2017-07-06 ENCOUNTER — Ambulatory Visit: Payer: BLUE CROSS/BLUE SHIELD | Admitting: Obstetrics and Gynecology

## 2017-07-06 VITALS — BP 161/106 | HR 85 | Ht 64.0 in | Wt 137.7 lb

## 2017-07-06 DIAGNOSIS — R102 Pelvic and perineal pain: Secondary | ICD-10-CM

## 2017-07-06 LAB — POCT URINALYSIS DIPSTICK
Bilirubin, UA: NEGATIVE
Blood, UA: NEGATIVE
Glucose, UA: NEGATIVE
Ketones, UA: NEGATIVE
LEUKOCYTES UA: NEGATIVE
NITRITE UA: NEGATIVE
PH UA: 6.5 (ref 5.0–8.0)
PROTEIN UA: NEGATIVE
Spec Grav, UA: 1.01 (ref 1.010–1.025)
UROBILINOGEN UA: 0.2 U/dL

## 2017-07-06 NOTE — Progress Notes (Signed)
  Subjective:     Patient ID: Donna Jackson, female   DOB: 26-May-1970, 47 y.o.   MRN: [id number]  HPI Reports sudden onset of stabbing pains in LLQ yesterday with no change in activity or trauma. Pain did not radiate, or last more than a few seconds. No relationship to activity. No changes in bowel or bladder habits. Denies pain with last intercourse. Denies fever or ovarian cyst/renal stone like pain as she has had both in the past.  Review of Systems  Constitutional: Negative.   HENT: Negative.   Eyes: Negative.   Respiratory: Negative.   Cardiovascular: Negative.   Gastrointestinal: Negative.   Endocrine: Negative.   Allergic/Immunologic: Negative.   Neurological: Negative.   Hematological: Negative.   Psychiatric/Behavioral: Negative.        Objective:   Physical Exam A&Ox4 Blood pressure (!) 161/106, pulse 85, height  (1.626 m), weight 137 lb 11.2 oz (62.5 kg), last menstrual period 09/21/2014.  Abdomen soft and not tender, BSx4 normal Pelvic exam: normal external genitalia, vulva, vagina, cervix, uterus and adnexa.  Urinalysis    Component Value Date/Time   BILIRUBINUR neg 07/06/2017 1055   PROTEINUR Negative 07/06/2017 1055   UROBILINOGEN 0.2 07/06/2017 1055   NITRITE neg 07/06/2017 1055   LEUKOCYTESUR Negative 07/06/2017 1055       Assessment:     LLQ pain of unknown etiology    Plan:     Discussed common causes of lower pelvic pain. Suspect yesterdays pain was likely muscle spasms. Advised heat and tylenol as needed. If symptom returns or worsens is to return to office. Sending urine for culture.  Melody Shambley,CNM

## 2017-07-08 LAB — URINE CULTURE

## 2017-07-10 ENCOUNTER — Telehealth: Payer: Self-pay | Admitting: *Deleted

## 2017-07-10 NOTE — Telephone Encounter (Signed)
-----   Message from Purcell NailsMelody N Shambley, [place] sent at 07/10/2017 10:12 AM EDT ----- Please let her know culture was negative

## 2017-07-10 NOTE — Telephone Encounter (Signed)
Left pt detailed message labs normal

## 2017-07-19 ENCOUNTER — Other Ambulatory Visit: Payer: Self-pay | Admitting: Obstetrics and Gynecology

## 2017-08-22 ENCOUNTER — Other Ambulatory Visit: Payer: Self-pay | Admitting: Obstetrics and Gynecology

## 2017-12-11 ENCOUNTER — Other Ambulatory Visit: Payer: Self-pay | Admitting: Obstetrics and Gynecology

## 2017-12-11 DIAGNOSIS — Z1231 Encounter for screening mammogram for malignant neoplasm of breast: Secondary | ICD-10-CM

## 2017-12-13 DIAGNOSIS — I7301 Raynaud's syndrome with gangrene: Secondary | ICD-10-CM | POA: Diagnosis not present

## 2017-12-13 DIAGNOSIS — Z23 Encounter for immunization: Secondary | ICD-10-CM | POA: Diagnosis not present

## 2017-12-13 DIAGNOSIS — R768 Other specified abnormal immunological findings in serum: Secondary | ICD-10-CM | POA: Diagnosis not present

## 2018-01-16 ENCOUNTER — Ambulatory Visit
Admission: RE | Admit: 2018-01-16 | Discharge: 2018-01-16 | Disposition: A | Discharge status: Home / Self Care | Payer: BLUE CROSS/BLUE SHIELD | Source: Ambulatory Visit | Attending: Obstetrics and Gynecology | Admitting: Obstetrics and Gynecology

## 2018-01-16 DIAGNOSIS — Z1231 Encounter for screening mammogram for malignant neoplasm of breast: Secondary | ICD-10-CM | POA: Diagnosis not present

## 2018-02-21 ENCOUNTER — Other Ambulatory Visit: Payer: Self-pay | Admitting: Obstetrics and Gynecology

## 2018-02-26 ENCOUNTER — Other Ambulatory Visit: Payer: Self-pay | Admitting: Obstetrics and Gynecology

## 2018-03-05 ENCOUNTER — Other Ambulatory Visit: Payer: Self-pay

## 2018-03-05 ENCOUNTER — Ambulatory Visit
Admission: EM | Admit: 2018-03-05 | Discharge: 2018-03-05 | Disposition: A | Discharge status: Home / Self Care | Payer: BLUE CROSS/BLUE SHIELD | Attending: Family Medicine | Admitting: Family Medicine

## 2018-03-05 ENCOUNTER — Encounter: Payer: Self-pay | Admitting: Emergency Medicine

## 2018-03-05 DIAGNOSIS — R51 Headache: Secondary | ICD-10-CM | POA: Diagnosis not present

## 2018-03-05 DIAGNOSIS — J029 Acute pharyngitis, unspecified: Secondary | ICD-10-CM | POA: Diagnosis not present

## 2018-03-05 LAB — RAPID STREP SCREEN (MED CTR MEBANE ONLY): Streptococcus, Group A Screen (Direct): NEGATIVE

## 2018-03-05 MED ORDER — LIDOCAINE VISCOUS HCL 2 % MT SOLN: OROMUCOSAL | 0 refills | Status: DC

## 2018-03-05 NOTE — ED Provider Notes (Signed)
[facility]    CSN: [id number] Arrival date & time: 03/05/18  1541  History   Chief Complaint Chief Complaint  Patient presents with  . Sore Throat   HPI  48 year old female presents with sore throat.  Patient reports that her sore throat started last night.  Moderate in severity. No fever.  She does note that she had a headache this morning but it is now resolved.  No body aches.  No chills.  She has taken Tylenol without improvement.  No known exacerbating factors.  No other associated symptoms.  No other complaints.  PMH, Surgical Hx, Family Hx, Social History reviewed and updated as below.  Past Medical History:  Diagnosis Date  . Anxiety   . Chicken pox   . Connective tissue disease (HCC)   . DUB (dysfunctional uterine bleeding)   . Hypertension   . Kidney stones   . Raynauds syndrome    Patient Active Problem List   Diagnosis Date Noted  . Anxiety 03/05/2015  . Preventative health care 03/05/2015  . Elevated blood pressure 12/01/2014  . Menopause 09/22/2014  . History of nephrolithiasis 07/10/2014  . Connective tissue disease, undifferentiated (HCC) 12/04/2013    Past Surgical History:  Procedure Laterality Date  . CESAREAN SECTION  1997  . DILATATION & CURRETTAGE/HYSTEROSCOPY WITH RESECTOCOPE N/A 11/23/2014   Procedure: DILATATION & CURETTAGE/HYSTEROSCOPY WITH RESECTOCOPE;  Surgeon: Herold Harms, MD;  Location: [facility] ORS;  Service: Gynecology;  Laterality: N/A;  . DILATION AND CURETTAGE OF UTERUS  1996  . TONSILLECTOMY    . TONSILLECTOMY AND ADENOIDECTOMY  1982    OB History    Gravida  2   Para  1   Term  1   Preterm      AB  1   Living  1     SAB  1   TAB      Ectopic      Multiple      Live Births  1            Home Medications    Prior to Admission medications   Medication Sig Start Date End Date Taking? Authorizing Provider  Multiple Vitamins-Minerals (MULTIVITAMIN WITH MINERALS) tablet Take 1 tablet by  mouth daily.   Yes [provider]  PREMPRO 0.625-2.5 MG tablet TAKE 1 TABLET BY MOUTH DAILY 02/27/18  Yes Shambley, Melody N, CNM  ALPRAZolam (XANAX) 0.5 MG tablet Take 1 tablet (0.5 mg total) by mouth every 6 (six) hours as needed for anxiety. 11/04/15   Shambley, Melody N, CNM  escitalopram (LEXAPRO) 10 MG tablet Take 1 tablet (10 mg total) by mouth daily. 11/04/15   Shambley, Melody N, CNM  hydroxychloroquine (PLAQUENIL) 200 MG tablet Take 200 mg by mouth every other day.  01/06/14   [provider]  lidocaine (XYLOCAINE) 2 % solution Gargle 15 mL every 3 hours as needed. May swallow if desired. 03/05/18   Tommie Sams, DO  zolpidem (AMBIEN) 5 MG tablet Take 5 mg by mouth at bedtime as needed for sleep.    [provider]    Family History Family History  Problem Relation Age of Onset  . Hypertension Mother   . Heart disease Father   . Hypertension Father   . Diabetes Maternal Grandmother     Social History Social History   Tobacco Use  . Smoking status: Never Smoker  . Smokeless tobacco: Never Used  Substance Use Topics  . Alcohol use: Yes  Comment: occas  . Drug use: No     Allergies   Celecoxib; Sulfa antibiotics; Amlodipine; and Levofloxacin   Review of Systems Review of Systems  Constitutional: Negative.   HENT: Positive for sore throat.   Neurological: Positive for headaches.   Physical Exam Triage Vital Signs ED Triage Vitals  Enc Vitals Group     BP --      Pulse --      Resp --      Temp --      Temp src --      SpO2 --      Weight 03/05/18 1636 130 lb (59 kg)     Height 03/05/18 1636 5\' 4"  (1.626 m)     Head Circumference --      Peak Flow --      Pain Score 03/05/18 1635 7     Pain Loc --      Pain Edu? --      Excl. in GC? --    Updated Vital Signs Ht 5\' 4"  (1.626 m)   Wt 59 kg   LMP 09/21/2014   BMI 22.31 kg/m   Visual Acuity Right Eye Distance:   Left Eye Distance:   Bilateral Distance:    Right Eye  Near:   Left Eye Near:    Bilateral Near:     Physical Exam Vitals signs and nursing note reviewed.  Constitutional:      General: She is not in acute distress. HENT:     Head: Normocephalic and atraumatic.     Nose: Nose normal.     Mouth/Throat:     Comments: Oropharynx with moderate erythema.  Cobblestoning noted. Eyes:     General:        Right eye: No discharge.        Left eye: No discharge.     Conjunctiva/sclera: Conjunctivae normal.  Cardiovascular:     Rate and Rhythm: Normal rate and regular rhythm.  Pulmonary:     Effort: Pulmonary effort is normal.     Breath sounds: Normal breath sounds.  Neurological:     Mental Status: She is alert.  Psychiatric:        Mood and Affect: Mood normal.        Behavior: Behavior normal.    UC Treatments / Results  Labs (all labs ordered are listed, but only abnormal results are displayed) Labs Reviewed  RAPID STREP SCREEN ([facility] ONLY)  CULTURE, GROUP A STREP [facility])    EKG None  Radiology No results found.  Procedures Procedures (including critical care time)  Medications Ordered in UC Medications - No data to display  Initial Impression / Assessment and Plan / UC Course  I have reviewed the triage vital signs and the nursing notes.  Pertinent labs & imaging results that were available during my care of the patient were reviewed by me and considered in my medical decision making (see chart for details).    48 year old female presents with viral pharyngitis.  Strep negative.  Treating with viscous lidocaine.   Final Clinical Impressions(s) / UC Diagnoses   Final diagnoses:  Viral pharyngitis   Discharge Instructions   None    ED Prescriptions    Medication Sig Dispense Auth. Provider   lidocaine (XYLOCAINE) 2 % solution Gargle 15 mL every 3 hours as needed. May swallow if desired. 200 mL Tommie Samsook, Cristal Howatt G, DO     Controlled Substance Prescriptions [place] Controlled Substance Registry consulted? Not  Applicable   Tommie Sams, DO 03/05/18 1802

## 2018-03-05 NOTE — ED Triage Notes (Signed)
Patient c/o sore throat that started last night.  Patient denies fevers.  

## 2018-03-08 LAB — CULTURE, GROUP A STREP (THRC)

## 2018-08-26 ENCOUNTER — Other Ambulatory Visit: Payer: Self-pay | Admitting: Obstetrics and Gynecology

## 2018-10-30 ENCOUNTER — Other Ambulatory Visit: Payer: Self-pay | Admitting: Obstetrics and Gynecology

## 2018-12-16 DIAGNOSIS — Z23 Encounter for immunization: Secondary | ICD-10-CM | POA: Diagnosis not present

## 2018-12-16 DIAGNOSIS — R768 Other specified abnormal immunological findings in serum: Secondary | ICD-10-CM | POA: Diagnosis not present

## 2018-12-16 DIAGNOSIS — I7301 Raynaud's syndrome with gangrene: Secondary | ICD-10-CM | POA: Diagnosis not present

## 2018-12-27 ENCOUNTER — Ambulatory Visit: Payer: BC Managed Care – PPO | Admitting: Internal Medicine

## 2018-12-27 ENCOUNTER — Other Ambulatory Visit: Payer: Self-pay

## 2018-12-27 ENCOUNTER — Encounter: Payer: Self-pay | Admitting: Internal Medicine

## 2018-12-27 VITALS — BP 120/82 | HR 82 | Ht 64.0 in | Wt 138.0 lb

## 2018-12-27 DIAGNOSIS — Z1231 Encounter for screening mammogram for malignant neoplasm of breast: Secondary | ICD-10-CM

## 2018-12-27 DIAGNOSIS — Z780 Asymptomatic menopausal state: Secondary | ICD-10-CM

## 2018-12-27 DIAGNOSIS — M359 Systemic involvement of connective tissue, unspecified: Secondary | ICD-10-CM

## 2018-12-27 DIAGNOSIS — Z1211 Encounter for screening for malignant neoplasm of colon: Secondary | ICD-10-CM | POA: Diagnosis not present

## 2018-12-27 DIAGNOSIS — I7300 Raynaud's syndrome without gangrene: Secondary | ICD-10-CM

## 2018-12-27 MED ORDER — PREMPRO 0.625-2.5 MG PO TABS: 1.0000 | ORAL_TABLET | Freq: Every day | ORAL | 5 refills | Status: DC

## 2018-12-27 NOTE — Progress Notes (Signed)
Date:  12/27/2018   Name:  Donna Jackson   DOB:  1970-07-28   MRN:  [id number]   Chief Complaint: Establish Care (Needs mammo order. Due next month. [place] order needed. )  Menopausal sx - on HRT for the past 4 years.  Doing well with no bleeding or breast issues. She needs a refill.  She has never tried taking a lower dose.  CTD with Raynaud's - after 22 years on Plaquenil, she had medication stopped one year ago when labs returned normal.  She recently had labs repeated and still negative.  She feels well with none of the joint and muscle pain of previously.  She still has some Raynaud's symptoms.  Her Rheum is retiring - she may not return to them unless sx occur.   Lab Results  Component Value Date   CREATININE 0.69 11/04/2015   BUN 10 11/04/2015   NA 140 11/04/2015   K 3.9 11/04/2015   CL 100 11/04/2015   CO2 25 11/04/2015   Lab Results  Component Value Date   CHOL 158 11/04/2015   HDL 69 11/04/2015   LDLCALC 77 11/04/2015   TRIG 61 11/04/2015   CHOLHDL 2.3 11/04/2015   No results found for: TSH No results found for: HGBA1C   Review of Systems  Constitutional: Negative for chills, fatigue, fever and unexpected weight change.  Respiratory: Negative for chest tightness and shortness of breath.   Cardiovascular: Negative for chest pain, palpitations and leg swelling.  Gastrointestinal: Negative for abdominal pain, constipation and diarrhea.  Genitourinary: Negative for dysuria, menstrual problem, vaginal bleeding and vaginal discharge.  Musculoskeletal: Negative for arthralgias, gait problem and joint swelling.  Skin: Negative for color change and rash.  Neurological: Negative for dizziness and headaches.  Psychiatric/Behavioral: Negative for dysphoric mood and sleep disturbance. The patient is not nervous/anxious.     Patient Active Problem List   Diagnosis Date Noted  . Anxiety 03/05/2015  . Menopause 09/22/2014  . History of nephrolithiasis  07/10/2014  . Connective tissue disease, undifferentiated ([place]) 12/04/2013    Allergies  Allergen Reactions  . Celecoxib Shortness Of Breath  . Sulfa Antibiotics Shortness Of Breath  . Amlodipine Other (See Comments)    "headaches"  . Levofloxacin Other (See Comments)    Stomach upset    Past Surgical History:  Procedure Laterality Date  . CESAREAN SECTION  1997  . DILATATION & CURRETTAGE/HYSTEROSCOPY WITH RESECTOCOPE N/A 11/23/2014   Procedure: DILATATION & CURETTAGE/HYSTEROSCOPY WITH RESECTOCOPE;  Surgeon: Brayton Mars, MD;  Location: [facility] ORS;  Service: Gynecology;  Laterality: N/A;  . DILATION AND CURETTAGE OF UTERUS  1996  . TONSILLECTOMY    . TONSILLECTOMY AND ADENOIDECTOMY  1982    Social History   Tobacco Use  . Smoking status: Never Smoker  . Smokeless tobacco: Never Used  Substance Use Topics  . Alcohol use: Yes    Comment: occas  . Drug use: No     Medication list has been reviewed and updated.  Current Meds  Medication Sig  . Multiple Vitamins-Minerals (MULTIVITAMIN WITH MINERALS) tablet Take 1 tablet by mouth daily.  Marland Kitchen PREMPRO 0.625-2.5 MG tablet TAKE 1 TABLET BY MOUTH DAILY  . Probiotic Product (PROBIOTIC-10 PO) Take by mouth.    PHQ 2/9 Scores 12/27/2018  PHQ - 2 Score 0  PHQ- 9 Score 0    BP Readings from Last 3 Encounters:  12/27/18 120/82  07/06/17 (!) 161/106  06/14/16 (!) 140/100    Physical  Exam Vitals signs and nursing note reviewed.  Constitutional:      General: She is not in acute distress.    Appearance: Normal appearance. She is well-developed.  HENT:     Head: Normocephalic and atraumatic.  Neck:     Musculoskeletal: Normal range of motion.     Vascular: No carotid bruit.  Cardiovascular:     Rate and Rhythm: Normal rate and regular rhythm.     Pulses: Normal pulses.     Heart sounds: No murmur.  Pulmonary:     Effort: Pulmonary effort is normal. No respiratory distress.     Breath sounds: No wheezing or  rhonchi.  Musculoskeletal:     Right lower leg: No edema.     Left lower leg: No edema.  Lymphadenopathy:     Cervical: No cervical adenopathy.  Skin:    General: Skin is warm and dry.     Capillary Refill: Capillary refill takes less than 2 seconds.     Findings: No rash.  Neurological:     General: No focal deficit present.     Mental Status: She is alert and oriented to person, place, and time.  Psychiatric:        Behavior: Behavior normal.        Thought Content: Thought content normal.     Wt Readings from Last 3 Encounters:  12/27/18 138 lb (62.6 kg)  03/05/18 130 lb (59 kg)  07/06/17 137 lb 11.2 oz (62.5 kg)    BP 120/82 (BP Location: Right Arm, Patient Position: Sitting, Cuff Size: Normal)   Pulse 82   Ht 5\' 4"  (1.626 m)   Wt 138 lb (62.6 kg)   LMP 09/21/2014 (Exact Date)   SpO2 99%   BMI 23.69 kg/m   Assessment and Plan: 1. Menopause Will try a lower dose of HRT - maybe MWF dosing - estrogen, conjugated,-medroxyprogesterone (PREMPRO) 0.625-2.5 MG tablet; Take 1 tablet by mouth daily.  Dispense: 28 tablet; Refill: 5  2. Encounter for screening mammogram for breast cancer Due for mammogram - schedule at [facility] [place] - MM 3D SCREEN BREAST BILATERAL; Future  3. Connective tissue disease, undifferentiated (HCC) Recent labs normal No recurrent symptoms  4. Colon cancer screening Will refer to [facility] - Ambulatory referral to Gastroenterology   Partially dictated using Dragon software. Any errors are unintentional.  [facility], MD [facility]  12/27/2018

## 2019-01-07 ENCOUNTER — Encounter: Payer: Self-pay | Admitting: *Deleted

## 2019-01-20 ENCOUNTER — Telehealth: Payer: Self-pay

## 2019-01-20 ENCOUNTER — Other Ambulatory Visit: Payer: Self-pay

## 2019-01-20 DIAGNOSIS — Z1211 Encounter for screening for malignant neoplasm of colon: Secondary | ICD-10-CM

## 2019-01-20 NOTE — Telephone Encounter (Signed)
Gastroenterology Pre-Procedure Review  Request Date: Friday 02/14/19 Requesting Physician: Dr. Allen Norris  PATIENT REVIEW QUESTIONS: The patient responded to the following health history questions as indicated:    1. Are you having any GI issues? no 2. Do you have a personal history of Polyps? no 3. Do you have a family history of Colon Cancer or Polyps? yes (Grandfather colon cancer) 4. Diabetes Mellitus? no 5. Joint replacements in the past 12 months?no 6. Major health problems in the past 3 months?no 7. Any artificial heart valves, MVP, or defibrillator?no    MEDICATIONS & ALLERGIES:    Patient reports the following regarding taking any anticoagulation/antiplatelet therapy:   Plavix, Coumadin, Eliquis, Xarelto, Lovenox, Pradaxa, Brilinta, or Effient? no Aspirin? no  Patient confirms/reports the following medications:  Current Outpatient Medications  Medication Sig Dispense Refill  . estrogen, conjugated,-medroxyprogesterone (PREMPRO) 0.625-2.5 MG tablet Take 1 tablet by mouth daily. 28 tablet 5  . Multiple Vitamins-Minerals (MULTIVITAMIN WITH MINERALS) tablet Take 1 tablet by mouth daily.    . Probiotic Product (PROBIOTIC-10 PO) Take by mouth.     No current facility-administered medications for this visit.    Patient confirms/reports the following allergies:  Allergies  Allergen Reactions  . Celecoxib Shortness Of Breath  . Sulfa Antibiotics Shortness Of Breath  . Amlodipine Other (See Comments)    "headaches"  . Levofloxacin Other (See Comments)    Stomach upset    No orders of the defined types were placed in this encounter.   AUTHORIZATION INFORMATION Primary Insurance: 1D#: Group #:  Secondary Insurance: 1D#: Group #:  SCHEDULE INFORMATION: Date: 02/14/19 Time: Location:[facility]

## 2019-02-12 ENCOUNTER — Other Ambulatory Visit: Payer: Self-pay

## 2019-02-12 ENCOUNTER — Other Ambulatory Visit
Admission: RE | Admit: 2019-02-12 | Discharge: 2019-02-12 | Disposition: A | Discharge status: Home / Self Care | Payer: BC Managed Care – PPO | Source: Ambulatory Visit | Attending: Gastroenterology | Admitting: Gastroenterology

## 2019-02-12 DIAGNOSIS — Z01812 Encounter for preprocedural laboratory examination: Secondary | ICD-10-CM | POA: Diagnosis not present

## 2019-02-12 DIAGNOSIS — Z20822 Contact with and (suspected) exposure to covid-19: Secondary | ICD-10-CM | POA: Diagnosis not present

## 2019-02-12 LAB — SARS CORONAVIRUS 2 (TAT 6-24 HRS): SARS Coronavirus 2: NEGATIVE

## 2019-02-14 ENCOUNTER — Ambulatory Visit: Payer: BC Managed Care – PPO | Admitting: Anesthesiology

## 2019-02-14 ENCOUNTER — Ambulatory Visit
Admission: RE | Admit: 2019-02-14 | Discharge: 2019-02-14 | Disposition: A | Discharge status: Home / Self Care | Payer: BC Managed Care – PPO | Attending: Gastroenterology | Admitting: Gastroenterology

## 2019-02-14 ENCOUNTER — Other Ambulatory Visit: Payer: Self-pay

## 2019-02-14 ENCOUNTER — Encounter
Admission: RE | Disposition: A | Discharge status: Home / Self Care | Payer: Self-pay | Source: Home / Self Care | Attending: Gastroenterology

## 2019-02-14 ENCOUNTER — Encounter: Payer: Self-pay | Admitting: Gastroenterology

## 2019-02-14 DIAGNOSIS — Z79899 Other long term (current) drug therapy: Secondary | ICD-10-CM | POA: Insufficient documentation

## 2019-02-14 DIAGNOSIS — Z793 Long term (current) use of hormonal contraceptives: Secondary | ICD-10-CM | POA: Insufficient documentation

## 2019-02-14 DIAGNOSIS — Z882 Allergy status to sulfonamides status: Secondary | ICD-10-CM | POA: Diagnosis not present

## 2019-02-14 DIAGNOSIS — Z8379 Family history of other diseases of the digestive system: Secondary | ICD-10-CM | POA: Diagnosis not present

## 2019-02-14 DIAGNOSIS — Z8249 Family history of ischemic heart disease and other diseases of the circulatory system: Secondary | ICD-10-CM | POA: Insufficient documentation

## 2019-02-14 DIAGNOSIS — F419 Anxiety disorder, unspecified: Secondary | ICD-10-CM | POA: Insufficient documentation

## 2019-02-14 DIAGNOSIS — Z833 Family history of diabetes mellitus: Secondary | ICD-10-CM | POA: Diagnosis not present

## 2019-02-14 DIAGNOSIS — Z888 Allergy status to other drugs, medicaments and biological substances status: Secondary | ICD-10-CM | POA: Insufficient documentation

## 2019-02-14 DIAGNOSIS — Z1211 Encounter for screening for malignant neoplasm of colon: Secondary | ICD-10-CM | POA: Diagnosis not present

## 2019-02-14 DIAGNOSIS — K640 First degree hemorrhoids: Secondary | ICD-10-CM | POA: Diagnosis not present

## 2019-02-14 DIAGNOSIS — Z87442 Personal history of urinary calculi: Secondary | ICD-10-CM | POA: Diagnosis not present

## 2019-02-14 DIAGNOSIS — Z791 Long term (current) use of non-steroidal anti-inflammatories (NSAID): Secondary | ICD-10-CM | POA: Diagnosis not present

## 2019-02-14 DIAGNOSIS — I7300 Raynaud's syndrome without gangrene: Secondary | ICD-10-CM | POA: Diagnosis not present

## 2019-02-14 HISTORY — PX: COLONOSCOPY WITH PROPOFOL: SHX5780

## 2019-02-14 HISTORY — DX: Personal history of urinary calculi: Z87.442

## 2019-02-14 SURGERY — COLONOSCOPY WITH PROPOFOL
Anesthesia: General

## 2019-02-14 MED ORDER — PROPOFOL 10 MG/ML IV BOLUS
INTRAVENOUS | Status: DC | PRN
  Administered 2019-02-14 (×2): 20 mg via INTRAVENOUS
  Administered 2019-02-14: 80 mg via INTRAVENOUS
  Administered 2019-02-14: 40 mg via INTRAVENOUS
  Administered 2019-02-14: 30 mg via INTRAVENOUS

## 2019-02-14 MED ORDER — PROPOFOL 500 MG/50ML IV EMUL
INTRAVENOUS | Status: DC | PRN
  Administered 2019-02-14: 140 ug/kg/min via INTRAVENOUS

## 2019-02-14 MED ORDER — SODIUM CHLORIDE 0.9 % IV SOLN: INTRAVENOUS | Status: DC | PRN

## 2019-02-14 NOTE — Transfer of Care (Signed)
Immediate Anesthesia Transfer of Care Note  Patient: Donna Jackson  Procedure(s) Performed: COLONOSCOPY WITH PROPOFOL (N/A )  Patient Location: PACU  Anesthesia Type:General  Level of Consciousness: sedated  Airway & Oxygen Therapy: Patient Spontanous Breathing  Post-op Assessment: Report given to RN and Post -op Vital signs reviewed and stable  Post vital signs: Reviewed and stable  Last Vitals:  Vitals Value Taken Time  BP 96/65 02/14/19 1126  Temp    Pulse 62 02/14/19 1127  Resp 17 02/14/19 1127  SpO2 100 % 02/14/19 1127  Vitals shown include unvalidated device data.  Last Pain:  Vitals:   02/14/19 1110  TempSrc: Temporal  PainSc:          Complications: No apparent anesthesia complications

## 2019-02-14 NOTE — Anesthesia Preprocedure Evaluation (Signed)
Anesthesia Evaluation  Patient identified by MRN, date of birth, ID band Patient awake    Reviewed: Allergy & Precautions, NPO status , Patient's Chart, lab work & pertinent test results  History of Anesthesia Complications Negative for: history of anesthetic complications  Airway Mallampati: II  TM Distance: >3 FB Neck ROM: Full    Dental no notable dental hx.    Pulmonary neg pulmonary ROS, neg sleep apnea, neg COPD,    breath sounds clear to auscultation- rhonchi (-) wheezing      Cardiovascular Exercise Tolerance: Good (-) hypertension(-) CAD and (-) Past MI  Rhythm:Regular Rate:Normal - Systolic murmurs and - Diastolic murmurs    Neuro/Psych Anxiety negative neurological ROS     GI/Hepatic negative GI ROS, Neg liver ROS,   Endo/Other  negative endocrine ROSneg diabetes  Renal/GU Renal disease: hx of nephrolithiasis.     Musculoskeletal negative musculoskeletal ROS (+)   Abdominal (+) - obese,   Peds  Hematology negative hematology ROS (+)   Anesthesia Other Findings   Reproductive/Obstetrics                             Anesthesia Physical Anesthesia Plan  ASA: II  Anesthesia Plan: General   Post-op Pain Management:    Induction: Intravenous  PONV Risk Score and Plan: 2 and Propofol infusion  Airway Management Planned: Natural Airway  Additional Equipment:   Intra-op Plan:   Post-operative Plan:   Informed Consent: I have reviewed the patients History and Physical, chart, labs and discussed the procedure including the risks, benefits and alternatives for the proposed anesthesia with the patient or authorized representative who has indicated his/her understanding and acceptance.     Dental advisory given  Plan Discussed with: CRNA and Anesthesiologist  Anesthesia Plan Comments:         Anesthesia Quick Evaluation

## 2019-02-14 NOTE — H&P (Signed)
Lucilla Lame, MD [facility] [address] [place], [place] [address] Phone: [phone] Fax : [phone]  Primary Care Physician:  Glean Hess, MD Primary Gastroenterologist:  Dr. Allen Norris  Pre-Procedure History & Physical: HPI:  Donna Jackson is a 49 y.o. female is here for a screening colonoscopy.   Past Medical History:  Diagnosis Date  . Anxiety 2016  . Chicken pox   . Connective tissue disease ([place])   . DUB (dysfunctional uterine bleeding)   . History of kidney stones   . Kidney stones   . Raynauds syndrome     Past Surgical History:  Procedure Laterality Date  . CESAREAN SECTION  1997  . DILATATION & CURRETTAGE/HYSTEROSCOPY WITH RESECTOCOPE N/A 11/23/2014   Procedure: DILATATION & CURETTAGE/HYSTEROSCOPY WITH RESECTOCOPE;  Surgeon: Brayton Mars, MD;  Location: [facility] ORS;  Service: Gynecology;  Laterality: N/A;  . DILATION AND CURETTAGE OF UTERUS  1996  . TONSILLECTOMY    . TONSILLECTOMY AND ADENOIDECTOMY  1982    Prior to Admission medications   Medication Sig Start Date End Date Taking? Authorizing Provider  estrogen, conjugated,-medroxyprogesterone (PREMPRO) 0.625-2.5 MG tablet Take 1 tablet by mouth daily. 12/27/18   Glean Hess, MD  Multiple Vitamins-Minerals (MULTIVITAMIN WITH MINERALS) tablet Take 1 tablet by mouth daily.    [provider]  Probiotic Product (PROBIOTIC-10 PO) Take by mouth.    [provider]    Allergies as of 01/20/2019 - Review Complete 12/27/2018  Allergen Reaction Noted  . Celecoxib Shortness Of Breath 07/10/2014  . Sulfa antibiotics Shortness Of Breath 07/10/2014  . Amlodipine Other (See Comments) 07/10/2014  . Levofloxacin Other (See Comments) 07/10/2014    Family History  Problem Relation Age of Onset  . Hypertension Mother   . Ulcerative colitis Mother   . Heart disease Father   . Hypertension Father   . Diabetes Maternal Grandmother     Social History   Socioeconomic History    . Marital status: Married    Spouse name: Not on file  . Number of children: 1  . Years of education: Not on file  . Highest education level: Not on file  Occupational History  . Not on file  Tobacco Use  . Smoking status: Never Smoker  . Smokeless tobacco: Never Used  Substance and Sexual Activity  . Alcohol use: Yes    Comment: occas  . Drug use: No  . Sexual activity: Yes    Birth control/protection: None  Other Topics Concern  . Not on file  Social History Narrative  . Not on file   Social Determinants of Health   Financial Resource Strain:   . Difficulty of Paying Living Expenses: Not on file  Food Insecurity:   . Worried About Charity fundraiser in the Last Year: Not on file  . Ran Out of Food in the Last Year: Not on file  Transportation Needs:   . Lack of Transportation (Medical): Not on file  . Lack of Transportation (Non-Medical): Not on file  Physical Activity:   . Days of Exercise per Week: Not on file  . Minutes of Exercise per Session: Not on file  Stress:   . Feeling of Stress : Not on file  Social Connections:   . Frequency of Communication with Friends and Family: Not on file  . Frequency of Social Gatherings with Friends and Family: Not on file  . Attends Religious Services: Not on file  . Active Member of Clubs or Organizations:  Not on file  . Attends Banker Meetings: Not on file  . Marital Status: Not on file  Intimate Partner Violence:   . Fear of Current or Ex-Partner: Not on file  . Emotionally Abused: Not on file  . Physically Abused: Not on file  . Sexually Abused: Not on file    Review of Systems: See HPI, otherwise negative ROS  Physical Exam: BP (!) 153/88   Pulse 81   Temp (!) 97.5 F (36.4 C) (Skin)   Resp 16   Ht 5\' 4"  (1.626 m)   Wt 60.8 kg   LMP 09/21/2014 (Exact Date)   SpO2 100%   BMI 23.00 kg/m  General:   Alert,  pleasant and cooperative in NAD Head:  Normocephalic and atraumatic. Neck:  Supple;  no masses or thyromegaly. Lungs:  Clear throughout to auscultation.    Heart:  Regular rate and rhythm. Abdomen:  Soft, nontender and nondistended. Normal bowel sounds, without guarding, and without rebound.   Neurologic:  Alert and  oriented x4;  grossly normal neurologically.  Impression/Plan: Donna Jackson is now here to undergo a screening colonoscopy.  Risks, benefits, and alternatives regarding colonoscopy have been reviewed with the patient.  Questions have been answered.  All parties agreeable.

## 2019-02-14 NOTE — Op Note (Signed)
[facility] Gastroenterology Patient Name: Donna Jackson Procedure Date: 02/14/2019 10:37 AM MRN: [id number] Account #: [id number] Date of Birth: 1970-08-23 Admit Type: Outpatient Age: 49 Room: [facility] ENDO ROOM 4 Gender: Female Note Status: Finalized Procedure:             Colonoscopy Indications:           Screening for colorectal malignant neoplasm Providers:             Lucilla Lame MD, MD Medicines:             Propofol per Anesthesia Complications:         No immediate complications. Procedure:             Pre-Anesthesia Assessment:                        - Prior to the procedure, a History and Physical was                         performed, and patient medications and allergies were                         reviewed. The patient's tolerance of previous                         anesthesia was also reviewed. The risks and benefits                         of the procedure and the sedation options and risks                         were discussed with the patient. All questions were                         answered, and informed consent was obtained. Prior                         Anticoagulants: The patient has taken no previous                         anticoagulant or antiplatelet agents. ASA Grade                         Assessment: II - A patient with mild systemic disease.                         After reviewing the risks and benefits, the patient                         was deemed in satisfactory condition to undergo the                         procedure.                        After obtaining informed consent, the colonoscope was                         passed under direct vision. Throughout the procedure,  the patient's blood pressure, pulse, and oxygen                         saturations were monitored continuously. The                         Colonoscope was introduced through the anus and                         advanced to the the  cecum, identified by appendiceal                         orifice and ileocecal valve. The colonoscopy was                         performed without difficulty. The patient tolerated                         the procedure well. The quality of the bowel                         preparation was excellent. Findings:      The perianal and digital rectal examinations were normal.      Non-bleeding internal hemorrhoids were found during retroflexion. The       hemorrhoids were Grade I (internal hemorrhoids that do not prolapse). Impression:            - Non-bleeding internal hemorrhoids.                        - No specimens collected. Recommendation:        - Discharge patient to home.                        - Resume previous diet.                        - Continue present medications.                        - Await pathology results. Procedure Code(s):     --- Professional ---                        [phone], Colonoscopy, flexible; diagnostic, including                         collection of specimen(s) by brushing or washing, when                         performed (separate procedure) Diagnosis Code(s):     --- Professional ---                        Z12.11, Encounter for screening for malignant neoplasm                         of colon CPT copyright 2019 American Medical Association. All rights reserved. The codes documented in this report are preliminary and upon coder review may  be revised to meet current compliance requirements. Midge Minium MD, MD 02/14/2019 11:14:55 AM This report  has been signed electronically. Number of Addenda: 0 Note Initiated On: 02/14/2019 10:37 AM Scope Withdrawal Time: 0 hours 7 minutes 37 seconds  Total Procedure Duration: 0 hours 15 minutes 59 seconds  Estimated Blood Loss:  Estimated blood loss: none.      [facility]

## 2019-02-14 NOTE — Anesthesia Postprocedure Evaluation (Signed)
Anesthesia Post Note  Patient: Donna Jackson  Procedure(s) Performed: COLONOSCOPY WITH PROPOFOL (N/A )  Patient location during evaluation: Endoscopy Anesthesia Type: General Level of consciousness: awake and alert and oriented Pain management: pain level controlled Vital Signs Assessment: post-procedure vital signs reviewed and stable Respiratory status: spontaneous breathing, nonlabored ventilation and respiratory function stable Cardiovascular status: blood pressure returned to baseline and stable Postop Assessment: no signs of nausea or vomiting Anesthetic complications: no     Last Vitals:  Vitals:   02/14/19 1140 02/14/19 1150  BP: (!) 129/58 129/88  Pulse: 69 65  Resp: 19 15  Temp:    SpO2: 100% 100%    Last Pain:  Vitals:   02/14/19 1110  TempSrc: Temporal  PainSc:                  Sesilia Poucher

## 2019-02-24 ENCOUNTER — Ambulatory Visit (INDEPENDENT_AMBULATORY_CARE_PROVIDER_SITE_OTHER): Payer: BC Managed Care – PPO | Admitting: Internal Medicine

## 2019-02-24 ENCOUNTER — Other Ambulatory Visit (HOSPITAL_COMMUNITY)
Admission: RE | Admit: 2019-02-24 | Discharge: 2019-02-24 | Disposition: A | Discharge status: Home / Self Care | Payer: BC Managed Care – PPO | Source: Ambulatory Visit | Attending: Internal Medicine | Admitting: Internal Medicine

## 2019-02-24 ENCOUNTER — Other Ambulatory Visit: Payer: Self-pay

## 2019-02-24 ENCOUNTER — Encounter: Payer: Self-pay | Admitting: Internal Medicine

## 2019-02-24 VITALS — BP 124/88 | HR 65 | Ht 64.0 in | Wt 140.0 lb

## 2019-02-24 DIAGNOSIS — Z1231 Encounter for screening mammogram for malignant neoplasm of breast: Secondary | ICD-10-CM | POA: Diagnosis not present

## 2019-02-24 DIAGNOSIS — Z0000 Encounter for general adult medical examination without abnormal findings: Secondary | ICD-10-CM | POA: Diagnosis not present

## 2019-02-24 DIAGNOSIS — Z124 Encounter for screening for malignant neoplasm of cervix: Secondary | ICD-10-CM

## 2019-02-24 DIAGNOSIS — I7300 Raynaud's syndrome without gangrene: Secondary | ICD-10-CM | POA: Diagnosis not present

## 2019-02-24 NOTE — Progress Notes (Signed)
Date:  02/24/2019   Name:  Donna Jackson   DOB:  06-Aug-1970   MRN:  [id number]   Chief Complaint: Annual Exam (No longer seeing GYN. Pap and Breast Exam today. ) Donna Jackson is a 49 y.o. female who presents today for her Complete Annual Exam. She feels well. She reports exercising walking. She reports she is sleeping poorly due to partner snoring. She denies breast complaints.  Mammogram  12/2017 - scheduled  for 03/03/2019 Pap  06/2014 @ GYN Colonoscopy  03/2019 Immunization History  Administered Date(s) Administered  . Influenza,inj,Quad PF,6+ Mos 11/10/2014, 12/13/2017, 12/16/2018    HPI  Lab Results  Component Value Date   CREATININE 0.69 11/04/2015   BUN 10 11/04/2015   NA 140 11/04/2015   K 3.9 11/04/2015   CL 100 11/04/2015   CO2 25 11/04/2015   Lab Results  Component Value Date   CHOL 158 11/04/2015   HDL 69 11/04/2015   LDLCALC 77 11/04/2015   TRIG 61 11/04/2015   CHOLHDL 2.3 11/04/2015   No results found for: TSH No results found for: HGBA1C   Review of Systems  Constitutional: Negative for chills, fatigue and fever.  HENT: Negative for congestion, hearing loss, tinnitus, trouble swallowing and voice change.   Eyes: Negative for visual disturbance.  Respiratory: Negative for cough, chest tightness, shortness of breath and wheezing.   Cardiovascular: Negative for chest pain, palpitations and leg swelling.  Gastrointestinal: Negative for abdominal pain, constipation, diarrhea and vomiting.  Endocrine: Negative for polydipsia and polyuria.  Genitourinary: Negative for dysuria, frequency, genital sores, vaginal bleeding and vaginal discharge.  Musculoskeletal: Negative for arthralgias, gait problem and joint swelling.  Skin: Negative for color change and rash.  Neurological: Negative for dizziness, tremors, light-headedness and headaches.  Hematological: Negative for adenopathy. Does not bruise/bleed easily.  Psychiatric/Behavioral: Positive for sleep  disturbance. Negative for dysphoric mood. The patient is not nervous/anxious.     Patient Active Problem List   Diagnosis Date Noted  . Raynaud's phenomenon without gangrene 12/27/2018  . Anxiety 03/05/2015  . Menopause 09/22/2014  . History of nephrolithiasis 07/10/2014  . Connective tissue disease, undifferentiated ([place]) 12/04/2013    Allergies  Allergen Reactions  . Celecoxib Shortness Of Breath  . Sulfa Antibiotics Shortness Of Breath  . Amlodipine Other (See Comments)    "headaches"  . Levofloxacin Other (See Comments)    Stomach upset    Past Surgical History:  Procedure Laterality Date  . CESAREAN SECTION  1997  . COLONOSCOPY WITH PROPOFOL N/A 02/14/2019   Procedure: COLONOSCOPY WITH PROPOFOL;  Surgeon: Lucilla Lame, MD;  Location: [facility] ENDOSCOPY;  Service: Endoscopy;  Laterality: N/A;  . DILATATION & CURRETTAGE/HYSTEROSCOPY WITH RESECTOCOPE N/A 11/23/2014   Procedure: DILATATION & CURETTAGE/HYSTEROSCOPY WITH RESECTOCOPE;  Surgeon: Brayton Mars, MD;  Location: [facility] ORS;  Service: Gynecology;  Laterality: N/A;  . DILATION AND CURETTAGE OF UTERUS  1996  . TONSILLECTOMY    . TONSILLECTOMY AND ADENOIDECTOMY  1982    Social History   Tobacco Use  . Smoking status: Never Smoker  . Smokeless tobacco: Never Used  Substance Use Topics  . Alcohol use: Yes    Comment: occas  . Drug use: No     Medication list has been reviewed and updated.  Current Meds  Medication Sig  . estrogen, conjugated,-medroxyprogesterone (PREMPRO) 0.625-2.5 MG tablet Take 1 tablet by mouth daily.  . Multiple Vitamins-Minerals (MULTIVITAMIN WITH MINERALS) tablet Take 1 tablet by mouth daily.  . Probiotic Product (  PROBIOTIC-10 PO) Take by mouth.    PHQ 2/9 Scores 02/24/2019 12/27/2018  PHQ - 2 Score 0 0  PHQ- 9 Score 2 0    BP Readings from Last 3 Encounters:  02/24/19 124/88  02/14/19 129/88  12/27/18 120/82    Physical Exam Vitals and nursing note reviewed.    Constitutional:      General: She is not in acute distress.    Appearance: She is well-developed.  HENT:     Head: Normocephalic and atraumatic.     Right Ear: Tympanic membrane and ear canal normal.     Left Ear: Tympanic membrane and ear canal normal.     Nose:     Right Sinus: No maxillary sinus tenderness.     Left Sinus: No maxillary sinus tenderness.  Eyes:     General: No scleral icterus.       Right eye: No discharge.        Left eye: No discharge.     Conjunctiva/sclera: Conjunctivae normal.  Neck:     Thyroid: No thyromegaly.     Vascular: No carotid bruit.  Cardiovascular:     Rate and Rhythm: Normal rate and regular rhythm.     Pulses: Normal pulses.     Heart sounds: Normal heart sounds.  Pulmonary:     Effort: Pulmonary effort is normal. No respiratory distress.     Breath sounds: No wheezing.  Chest:     Breasts:        Right: No mass, nipple discharge, skin change or tenderness.        Left: No mass, nipple discharge, skin change or tenderness.  Abdominal:     General: Bowel sounds are normal.     Palpations: Abdomen is soft.     Tenderness: There is no abdominal tenderness.     Hernia: There is no hernia in the left inguinal area or right inguinal area.  Genitourinary:    Labia:        Right: No rash, tenderness or lesion.        Left: No rash, tenderness or lesion.      Vagina: Normal.     Cervix: Normal.     Uterus: Normal.      Adnexa: Right adnexa normal and left adnexa normal.  Musculoskeletal:        General: Normal range of motion.     Cervical back: Normal range of motion. No erythema.  Lymphadenopathy:     Cervical: No cervical adenopathy.  Skin:    General: Skin is warm and dry.     Capillary Refill: Capillary refill takes less than 2 seconds.     Findings: No rash.     Comments: Fingers are cool but not cyanotic   Neurological:     General: No focal deficit present.     Mental Status: She is alert and oriented to person, place, and  time.     Cranial Nerves: No cranial nerve deficit.     Sensory: No sensory deficit.     Deep Tendon Reflexes: Reflexes are normal and symmetric.  Psychiatric:        Speech: Speech normal.        Behavior: Behavior normal.        Thought Content: Thought content normal.     Wt Readings from Last 3 Encounters:  02/24/19 140 lb (63.5 kg)  02/14/19 134 lb (60.8 kg)  12/27/18 138 lb (62.6 kg)    BP 124/88   Pulse  65   Ht 5\' 4"  (1.626 m)   Wt 140 lb (63.5 kg)   LMP 09/21/2014 (Exact Date)   SpO2 100%   BMI 24.03 kg/m   Assessment and Plan: 1. Annual physical exam Normal exam - CBC with Differential/Platelet - Comprehensive metabolic panel - Lipid panel - TSH - POCT urinalysis dipstick  2. Encounter for screening mammogram for breast cancer Scheduled for next week  3. Encounter for screening for cervical cancer  Normal exam, Pap obtained - [facility] - PAP  4. Raynaud's phenomenon without gangrene Did not tolerate previous trial of amlodipine due to headache Could consider low dose cardiazem - pt declines at this time due to stable nature of her symptoms   Partially dictated using 09/23/2014. Any errors are unintentional.  Animal nutritionist, MD [facility]  02/24/2019

## 2019-02-25 LAB — LIPID PANEL
Chol/HDL Ratio: 2.3 ratio (ref 0.0–4.4)
Cholesterol, Total: 144 mg/dL (ref 100–199)
HDL: 63 mg/dL (ref >39)
LDL Chol Calc (NIH): 68 mg/dL (ref 0–99)
Triglycerides: 65 mg/dL (ref 0–149)
VLDL Cholesterol Cal: 13 mg/dL (ref 5–40)

## 2019-02-25 LAB — CBC WITH DIFFERENTIAL/PLATELET
Basophils Absolute: 0.1 10*3/uL (ref 0.0–0.2)
Basos: 1 %
EOS (ABSOLUTE): 0.1 10*3/uL (ref 0.0–0.4)
Eos: 3 %
Hematocrit: 40.5 % (ref 34.0–46.6)
Hemoglobin: 13.5 g/dL (ref 11.1–15.9)
Immature Grans (Abs): 0 10*3/uL (ref 0.0–0.1)
Immature Granulocytes: 0 %
Lymphocytes Absolute: 1.6 10*3/uL (ref 0.7–3.1)
Lymphs: 36 %
MCH: 32.1 pg (ref 26.6–33.0)
MCHC: 33.3 g/dL (ref 31.5–35.7)
MCV: 96 fL (ref 79–97)
Monocytes Absolute: 0.6 10*3/uL (ref 0.1–0.9)
Monocytes: 13 %
Neutrophils Absolute: 2.1 10*3/uL (ref 1.4–7.0)
Neutrophils: 47 %
Platelets: 249 10*3/uL (ref 150–450)
RBC: 4.2 x10E6/uL (ref 3.77–5.28)
RDW: 11.8 % (ref 11.7–15.4)
WBC: 4.6 10*3/uL (ref 3.4–10.8)

## 2019-02-25 LAB — COMPREHENSIVE METABOLIC PANEL
ALT: 16 IU/L (ref 0–32)
AST: 27 IU/L (ref 0–40)
Albumin/Globulin Ratio: 1.9 (ref 1.2–2.2)
Albumin: 4.3 g/dL (ref 3.8–4.8)
Alkaline Phosphatase: 79 IU/L (ref 39–117)
BUN/Creatinine Ratio: 12 (ref 9–23)
BUN: 11 mg/dL (ref 6–24)
Bilirubin Total: 0.4 mg/dL (ref 0.0–1.2)
CO2: 24 mmol/L (ref 20–29)
Calcium: 9.5 mg/dL (ref 8.7–10.2)
Chloride: 103 mmol/L (ref 96–106)
Creatinine, Ser: 0.91 mg/dL (ref 0.57–1.00)
GFR calc Af Amer: 85 mL/min/{1.73_m2} (ref >59)
GFR calc non Af Amer: 74 mL/min/{1.73_m2} (ref >59)
Globulin, Total: 2.3 g/dL (ref 1.5–4.5)
Glucose: 80 mg/dL (ref 65–99)
Potassium: 4.6 mmol/L (ref 3.5–5.2)
Sodium: 140 mmol/L (ref 134–144)
Total Protein: 6.6 g/dL (ref 6.0–8.5)

## 2019-02-25 LAB — TSH: TSH: 3.02 u[IU]/mL (ref 0.450–4.500)

## 2019-02-26 LAB — CYTOLOGY - PAP
Adequacy: ABSENT
Comment: NEGATIVE
Diagnosis: NEGATIVE
High risk HPV: NEGATIVE

## 2019-03-03 ENCOUNTER — Ambulatory Visit
Admission: RE | Admit: 2019-03-03 | Discharge: 2019-03-03 | Disposition: A | Discharge status: Home / Self Care | Payer: BC Managed Care – PPO | Source: Ambulatory Visit | Attending: Internal Medicine | Admitting: Internal Medicine

## 2019-03-03 ENCOUNTER — Other Ambulatory Visit: Payer: Self-pay

## 2019-03-03 DIAGNOSIS — Z1231 Encounter for screening mammogram for malignant neoplasm of breast: Secondary | ICD-10-CM | POA: Insufficient documentation

## 2019-07-13 DIAGNOSIS — Z20822 Contact with and (suspected) exposure to covid-19: Secondary | ICD-10-CM | POA: Diagnosis not present

## 2019-07-13 DIAGNOSIS — J069 Acute upper respiratory infection, unspecified: Secondary | ICD-10-CM | POA: Diagnosis not present

## 2019-12-16 DIAGNOSIS — Z23 Encounter for immunization: Secondary | ICD-10-CM | POA: Diagnosis not present

## 2019-12-16 DIAGNOSIS — I7301 Raynaud's syndrome with gangrene: Secondary | ICD-10-CM | POA: Diagnosis not present

## 2020-01-27 ENCOUNTER — Other Ambulatory Visit: Payer: Self-pay | Admitting: Internal Medicine

## 2020-01-27 DIAGNOSIS — Z780 Asymptomatic menopausal state: Secondary | ICD-10-CM

## 2020-01-27 NOTE — Telephone Encounter (Signed)
Requested Prescriptions  Pending Prescriptions Disp Refills   PREMPRO 0.625-2.5 MG tablet [Pharmacy Med Name: PREMPRO 0.625MG /2.5MG  TAB28 PEACH] 28 tablet 2    Sig: TAKE 1 TABLET BY MOUTH DAILY     OB/GYN:  Hormone Combinations Passed - 01/27/2020  8:32 AM      Passed - Mammogram is up-to-date per Health Maintenance      Passed - Last BP in normal range    BP Readings from Last 1 Encounters:  02/24/19 124/88         Passed - Valid encounter within last 12 months    Recent Outpatient Visits          11 months ago Annual physical exam   [facility] Reubin Milan, MD   1 year ago Menopause   [facility] Reubin Milan, MD      Future Appointments            In 1 month Judithann Graves Nyoka Cowden, MD [facility], [facility]

## 2020-02-06 DIAGNOSIS — Z20822 Contact with and (suspected) exposure to covid-19: Secondary | ICD-10-CM | POA: Diagnosis not present

## 2020-02-16 ENCOUNTER — Telehealth: Payer: Self-pay | Admitting: Internal Medicine

## 2020-02-16 ENCOUNTER — Other Ambulatory Visit: Payer: Self-pay | Admitting: Internal Medicine

## 2020-02-16 NOTE — Telephone Encounter (Signed)
Pt has an appt for cpe on 02-26-2020 and the pt felt pea size mass in her left breast about 5 days ago. Pt has been dx with covid on 02-06-2020. Please advice

## 2020-02-16 NOTE — Telephone Encounter (Signed)
Pt set up appt for 02/17/20

## 2020-02-17 ENCOUNTER — Encounter: Payer: Self-pay | Admitting: Internal Medicine

## 2020-02-17 ENCOUNTER — Ambulatory Visit: Payer: BC Managed Care – PPO | Admitting: Internal Medicine

## 2020-02-17 ENCOUNTER — Other Ambulatory Visit: Payer: Self-pay

## 2020-02-17 VITALS — BP 128/86 | HR 76 | Temp 97.9°F | Ht 64.0 in | Wt 137.0 lb

## 2020-02-17 DIAGNOSIS — N6320 Unspecified lump in the left breast, unspecified quadrant: Secondary | ICD-10-CM | POA: Diagnosis not present

## 2020-02-17 NOTE — Progress Notes (Signed)
Date:  02/17/2020   Name:  Donna Jackson   DOB:  01-17-1971   MRN:  [id number]   Chief Complaint: Breast Mass (X1 week, left breast, pea size, not painful, moves, hasnt changed in size, positive for Covid 12/31 ([facility])) Noted in left breast after checking her breasts when she got her mammogram reminder letter.  No nipple discharge, no heat or pain, no trauma. She is recovering from Covid - mostly just decreased taste and smell.  HPI Last Mammogram 02/2019 - [facility]ads 1  Lab Results  Component Value Date   CREATININE 0.91 02/24/2019   BUN 11 02/24/2019   NA 140 02/24/2019   K 4.6 02/24/2019   CL 103 02/24/2019   CO2 24 02/24/2019   Lab Results  Component Value Date   CHOL 144 02/24/2019   HDL 63 02/24/2019   LDLCALC 68 02/24/2019   TRIG 65 02/24/2019   CHOLHDL 2.3 02/24/2019   Lab Results  Component Value Date   TSH 3.020 02/24/2019   No results found for: HGBA1C Lab Results  Component Value Date   WBC 4.6 02/24/2019   HGB 13.5 02/24/2019   HCT 40.5 02/24/2019   MCV 96 02/24/2019   PLT 249 02/24/2019   Lab Results  Component Value Date   ALT 16 02/24/2019   AST 27 02/24/2019   ALKPHOS 79 02/24/2019   BILITOT 0.4 02/24/2019     Review of Systems  Constitutional: Negative for chills, fatigue and fever.  Respiratory: Negative for shortness of breath.   Cardiovascular: Negative for chest pain.  Skin: Negative for color change, rash and wound.    Patient Active Problem List   Diagnosis Date Noted  . Raynaud's phenomenon without gangrene 12/27/2018  . Anxiety 03/05/2015  . Menopause 09/22/2014  . History of nephrolithiasis 07/10/2014  . Connective tissue disease, undifferentiated ([facility]) 12/04/2013    Allergies  Allergen Reactions  . Celecoxib Shortness Of Breath  . Sulfa Antibiotics Shortness Of Breath  . Amlodipine Other (See Comments)    "headaches"  . Levofloxacin Other (See Comments)    Stomach upset    Past Surgical History:  Procedure  Laterality Date  . CESAREAN SECTION  1997  . COLONOSCOPY WITH PROPOFOL N/A 02/14/2019   Procedure: COLONOSCOPY WITH PROPOFOL;  Surgeon: Midge Minium, MD;  Location: [facility] ENDOSCOPY;  Service: Endoscopy;  Laterality: N/A;  . DILATATION & CURRETTAGE/HYSTEROSCOPY WITH RESECTOCOPE N/A 11/23/2014   Procedure: DILATATION & CURETTAGE/HYSTEROSCOPY WITH RESECTOCOPE;  Surgeon: Herold Harms, MD;  Location: [facility] ORS;  Service: Gynecology;  Laterality: N/A;  . DILATION AND CURETTAGE OF UTERUS  1996  . TONSILLECTOMY    . TONSILLECTOMY AND ADENOIDECTOMY  1982    Social History   Tobacco Use  . Smoking status: Never Smoker  . Smokeless tobacco: Never Used  Vaping Use  . Vaping Use: Never used  Substance Use Topics  . Alcohol use: Yes    Comment: occas  . Drug use: No     Medication list has been reviewed and updated.  Current Meds  Medication Sig  . Multiple Vitamins-Minerals (MULTIVITAMIN WITH MINERALS) tablet Take 1 tablet by mouth daily.  Marland Kitchen PREMPRO 0.625-2.5 MG tablet TAKE 1 TABLET BY MOUTH DAILY  . Probiotic Product (PROBIOTIC-10 PO) Take by mouth.    PHQ 2/9 Scores 02/17/2020 02/24/2019 12/27/2018  PHQ - 2 Score 0 0 0  PHQ- 9 Score 3 2 0    GAD 7 : Generalized Anxiety Score 02/17/2020  Nervous, Anxious, on Edge  1  Control/stop worrying 1  Worry too much - different things 1  Trouble relaxing 0  Restless 0  Easily annoyed or irritable 0  Afraid - awful might happen 0  Total GAD 7 Score 3    BP Readings from Last 3 Encounters:  02/17/20 128/86  02/24/19 124/88  02/14/19 129/88    Physical Exam Vitals and nursing note reviewed.  Constitutional:      General: She is not in acute distress.    Appearance: She is well-developed.  HENT:     Head: Normocephalic and atraumatic.  Cardiovascular:     Rate and Rhythm: Normal rate and regular rhythm.     Heart sounds: Normal heart sounds.  Pulmonary:     Effort: Pulmonary effort is normal. No respiratory distress.      Breath sounds: Normal breath sounds. No wheezing or rhonchi.  Chest:  Breasts:     Right: Normal.     Left: Mass present. No nipple discharge, skin change or tenderness.        Comments: Smooth firm mobile non tender mass Musculoskeletal:        General: Normal range of motion.  Skin:    General: Skin is warm and dry.     Findings: No rash.  Neurological:     Mental Status: She is alert and oriented to person, place, and time.  Psychiatric:        Mood and Affect: Mood and affect normal.        Behavior: Behavior normal.        Thought Content: Thought content normal.     Wt Readings from Last 3 Encounters:  02/17/20 137 lb (62.1 kg)  02/24/19 140 lb (63.5 kg)  02/14/19 134 lb (60.8 kg)    BP 128/86   Pulse 76   Temp 97.9 F (36.6 C) (Oral)   Ht 5\' 4"  (1.626 m)   Wt 137 lb (62.1 kg)   LMP 09/21/2014 (Exact Date)   SpO2 100%   BMI 23.52 kg/m   Assessment and Plan: 1. Mass of left breast - MM DIAG BREAST TOMO BILATERAL; Future - 09/23/2014 BREAST LTD UNI RIGHT INC AXILLA; Future - US BREAST LTD UNI LEFT INC AXILLA; Future   Partially dictated using [place]. Any errors are unintentional.  Animal nutritionist, MD [facility]  02/17/2020

## 2020-02-24 ENCOUNTER — Ambulatory Visit
Admission: RE | Admit: 2020-02-24 | Discharge: 2020-02-24 | Disposition: A | Discharge status: Home / Self Care | Payer: BC Managed Care – PPO | Source: Ambulatory Visit | Attending: Internal Medicine | Admitting: Internal Medicine

## 2020-02-24 ENCOUNTER — Other Ambulatory Visit: Payer: Self-pay

## 2020-02-24 DIAGNOSIS — N6320 Unspecified lump in the left breast, unspecified quadrant: Secondary | ICD-10-CM | POA: Insufficient documentation

## 2020-02-24 DIAGNOSIS — N6489 Other specified disorders of breast: Secondary | ICD-10-CM | POA: Diagnosis not present

## 2020-02-24 DIAGNOSIS — R928 Other abnormal and inconclusive findings on diagnostic imaging of breast: Secondary | ICD-10-CM | POA: Diagnosis not present

## 2020-02-26 ENCOUNTER — Encounter: Payer: BC Managed Care – PPO | Admitting: Internal Medicine

## 2020-06-22 NOTE — Progress Notes (Signed)
Date:  06/23/2020   Name:  Donna Jackson   DOB:  [birth year]   MRN:  [id number]   Chief Complaint: Annual Exam (Breast exam no pap)  Donna Jackson is a 50 y.o. female who presents today for her Complete Annual Exam. She feels well. She reports exercising - at home 3-4 times weekly. She reports she is sleeping fairly well. Breast complaints - needs follow up on left mass from January. It has resolved.  Mammogram: 02/2020 negative DEXA: none Pap smear: 02/2019 neg with cotesting Colonoscopy: 02/2019 repeat 2031  Immunization History  Administered Date(s) Administered  . Influenza,inj,Quad PF,6+ Mos 11/10/2014, 12/13/2017, 12/16/2018, 12/16/2019    HPI  Lab Results  Component Value Date   CREATININE 0.91 02/24/2019   BUN 11 02/24/2019   NA 140 02/24/2019   K 4.6 02/24/2019   CL 103 02/24/2019   CO2 24 02/24/2019   Lab Results  Component Value Date   CHOL 144 02/24/2019   HDL 63 02/24/2019   LDLCALC 68 02/24/2019   TRIG 65 02/24/2019   CHOLHDL 2.3 02/24/2019   Lab Results  Component Value Date   TSH 3.020 02/24/2019   No results found for: HGBA1C Lab Results  Component Value Date   WBC 4.6 02/24/2019   HGB 13.5 02/24/2019   HCT 40.5 02/24/2019   MCV 96 02/24/2019   PLT 249 02/24/2019   Lab Results  Component Value Date   ALT 16 02/24/2019   AST 27 02/24/2019   ALKPHOS 79 02/24/2019   BILITOT 0.4 02/24/2019     Review of Systems  Constitutional: Negative for chills, fatigue and fever.  HENT: Negative for congestion, hearing loss, tinnitus, trouble swallowing and voice change.   Eyes: Negative for visual disturbance.  Respiratory: Negative for cough, chest tightness, shortness of breath and wheezing.   Cardiovascular: Negative for chest pain, palpitations and leg swelling.  Gastrointestinal: Negative for abdominal pain, constipation, diarrhea and vomiting.  Endocrine: Negative for polydipsia and polyuria.  Genitourinary: Negative for dysuria,  frequency, genital sores, vaginal bleeding and vaginal discharge.  Musculoskeletal: Negative for arthralgias, gait problem and joint swelling.  Skin: Negative for color change and rash.  Neurological: Negative for dizziness, tremors, light-headedness and headaches.  Hematological: Negative for adenopathy. Does not bruise/bleed easily.  Psychiatric/Behavioral: Negative for dysphoric mood and sleep disturbance. The patient is not nervous/anxious.     Patient Active Problem List   Diagnosis Date Noted  . Raynaud's phenomenon without gangrene 12/27/2018  . Anxiety 03/05/2015  . Menopause 09/22/2014  . History of nephrolithiasis 07/10/2014  . Connective tissue disease, undifferentiated ([facility]) 12/04/2013    Allergies  Allergen Reactions  . Celecoxib Shortness Of Breath  . Sulfa Antibiotics Shortness Of Breath  . Amlodipine Other (See Comments)    "headaches"  . Levofloxacin Other (See Comments)    Stomach upset    Past Surgical History:  Procedure Laterality Date  . CESAREAN SECTION  1997  . COLONOSCOPY WITH PROPOFOL N/A 02/14/2019   Procedure: COLONOSCOPY WITH PROPOFOL;  Surgeon: Midge Minium, MD;  Location: [facility] ENDOSCOPY;  Service: Endoscopy;  Laterality: N/A;  . DILATATION & CURRETTAGE/HYSTEROSCOPY WITH RESECTOCOPE N/A 11/23/2014   Procedure: DILATATION & CURETTAGE/HYSTEROSCOPY WITH RESECTOCOPE;  Surgeon: Herold Harms, MD;  Location: [facility] ORS;  Service: Gynecology;  Laterality: N/A;  . DILATION AND CURETTAGE OF UTERUS  1996  . TONSILLECTOMY    . TONSILLECTOMY AND ADENOIDECTOMY  1982    Social History   Tobacco Use  . Smoking status:  Never Smoker  . Smokeless tobacco: Never Used  Vaping Use  . Vaping Use: Never used  Substance Use Topics  . Alcohol use: Yes    Comment: occas  . Drug use: No     Medication list has been reviewed and updated.  Current Meds  Medication Sig  . Multiple Vitamins-Minerals (MULTIVITAMIN WITH MINERALS) tablet Take 1 tablet by mouth  daily.  Marland Kitchen PREMPRO 0.625-2.5 MG tablet TAKE 1 TABLET BY MOUTH DAILY  . Probiotic Product (PROBIOTIC-10 PO) Take by mouth.    PHQ 2/9 Scores 06/23/2020 02/17/2020 02/24/2019 12/27/2018  PHQ - 2 Score 0 0 0 0  PHQ- 9 Score 0 3 2 0    GAD 7 : Generalized Anxiety Score 06/23/2020 02/17/2020  Nervous, Anxious, on Edge 1 1  Control/stop worrying 1 1  Worry too much - different things 1 1  Trouble relaxing 0 0  Restless 0 0  Easily annoyed or irritable 0 0  Afraid - awful might happen 0 0  Total GAD 7 Score 3 3  Anxiety Difficulty Not difficult at all -    BP Readings from Last 3 Encounters:  06/23/20 118/78  02/17/20 128/86  02/24/19 124/88    Physical Exam Vitals and nursing note reviewed.  Constitutional:      General: She is not in acute distress.    Appearance: She is well-developed.  HENT:     Head: Normocephalic and atraumatic.     Right Ear: Hearing and ear canal normal. No middle ear effusion. Tympanic membrane is retracted.     Left Ear: Hearing and ear canal normal.  No middle ear effusion. Tympanic membrane is retracted.     Nose:     Right Sinus: No maxillary sinus tenderness.     Left Sinus: No maxillary sinus tenderness.  Eyes:     General: No scleral icterus.       Right eye: No discharge.        Left eye: No discharge.     Conjunctiva/sclera: Conjunctivae normal.  Neck:     Thyroid: No thyromegaly.     Vascular: No carotid bruit.  Cardiovascular:     Rate and Rhythm: Normal rate and regular rhythm.     Pulses: Normal pulses.     Heart sounds: Normal heart sounds.  Pulmonary:     Effort: Pulmonary effort is normal. No respiratory distress.     Breath sounds: No wheezing.  Chest:  Breasts:     Right: No mass, nipple discharge, skin change or tenderness.     Left: No mass, nipple discharge, skin change or tenderness.    Abdominal:     General: Bowel sounds are normal.     Palpations: Abdomen is soft.     Tenderness: There is no abdominal tenderness.   Musculoskeletal:     Cervical back: Normal range of motion. No erythema.     Right lower leg: No edema.     Left lower leg: No edema.  Lymphadenopathy:     Cervical: No cervical adenopathy.  Skin:    General: Skin is warm and dry.     Capillary Refill: Capillary refill takes less than 2 seconds.     Findings: No rash.  Neurological:     General: No focal deficit present.     Mental Status: She is alert and oriented to person, place, and time.     Cranial Nerves: No cranial nerve deficit.     Sensory: No sensory deficit.  Deep Tendon Reflexes: Reflexes are normal and symmetric.  Psychiatric:        Attention and Perception: Attention normal.        Mood and Affect: Mood normal.        Behavior: Behavior normal.     Wt Readings from Last 3 Encounters:  06/23/20 138 lb (62.6 kg)  02/17/20 137 lb (62.1 kg)  02/24/19 140 lb (63.5 kg)    BP 118/78   Pulse 79   Temp 98.1 F (36.7 C) (Oral)   Ht 5\' 4"  (1.626 m)   Wt 138 lb (62.6 kg)   LMP 09/21/2014 (Exact Date)   SpO2 98%   BMI 23.69 kg/m   Assessment and Plan: 1. Annual physical exam Normal exam; continue healthy diet and exercise Consider Shingrix vaccine - CBC with Differential/Platelet - Comprehensive metabolic panel - Lipid panel - TSH  2. Menopause Doing well on Prempro qod Call for refill when needed  3. Connective tissue disease, undifferentiated (HCC) Followed annually by Rheumatology Has been off Plaquenil for three years without any joint complaints.   Partially dictated using 09/23/2014. Any errors are unintentional.  Animal nutritionist, MD [facility]  06/23/2020

## 2020-06-23 ENCOUNTER — Encounter: Payer: Self-pay | Admitting: Internal Medicine

## 2020-06-23 ENCOUNTER — Ambulatory Visit (INDEPENDENT_AMBULATORY_CARE_PROVIDER_SITE_OTHER): Payer: BC Managed Care – PPO | Admitting: Internal Medicine

## 2020-06-23 ENCOUNTER — Other Ambulatory Visit: Payer: Self-pay

## 2020-06-23 VITALS — BP 118/78 | HR 79 | Temp 98.1°F | Ht 64.0 in | Wt 138.0 lb

## 2020-06-23 DIAGNOSIS — Z780 Asymptomatic menopausal state: Secondary | ICD-10-CM | POA: Diagnosis not present

## 2020-06-23 DIAGNOSIS — Z0000 Encounter for general adult medical examination without abnormal findings: Secondary | ICD-10-CM

## 2020-06-23 DIAGNOSIS — M359 Systemic involvement of connective tissue, unspecified: Secondary | ICD-10-CM | POA: Diagnosis not present

## 2020-06-24 LAB — CBC WITH DIFFERENTIAL/PLATELET
Basophils Absolute: 0.1 10*3/uL (ref 0.0–0.2)
Basos: 1 %
EOS (ABSOLUTE): 0.2 10*3/uL (ref 0.0–0.4)
Eos: 2 %
Hematocrit: 43.2 % (ref 34.0–46.6)
Hemoglobin: 14.3 g/dL (ref 11.1–15.9)
Immature Grans (Abs): 0 10*3/uL (ref 0.0–0.1)
Immature Granulocytes: 0 %
Lymphocytes Absolute: 1.5 10*3/uL (ref 0.7–3.1)
Lymphs: 22 %
MCH: 32.5 pg (ref 26.6–33.0)
MCHC: 33.1 g/dL (ref 31.5–35.7)
MCV: 98 fL — ABNORMAL HIGH (ref 79–97)
Monocytes Absolute: 0.9 10*3/uL (ref 0.1–0.9)
Monocytes: 13 %
Neutrophils Absolute: 4.4 10*3/uL (ref 1.4–7.0)
Neutrophils: 62 %
Platelets: 258 10*3/uL (ref 150–450)
RBC: 4.4 x10E6/uL (ref 3.77–5.28)
RDW: 11.7 % (ref 11.7–15.4)
WBC: 7.1 10*3/uL (ref 3.4–10.8)

## 2020-06-24 LAB — COMPREHENSIVE METABOLIC PANEL
ALT: 11 IU/L (ref 0–32)
AST: 21 IU/L (ref 0–40)
Albumin/Globulin Ratio: 1.8 (ref 1.2–2.2)
Albumin: 4.5 g/dL (ref 3.8–4.9)
Alkaline Phosphatase: 87 IU/L (ref 44–121)
BUN/Creatinine Ratio: 14 (ref 9–23)
BUN: 13 mg/dL (ref 6–24)
Bilirubin Total: 0.4 mg/dL (ref 0.0–1.2)
CO2: 24 mmol/L (ref 20–29)
Calcium: 9.7 mg/dL (ref 8.7–10.2)
Chloride: 103 mmol/L (ref 96–106)
Creatinine, Ser: 0.93 mg/dL (ref 0.57–1.00)
Globulin, Total: 2.5 g/dL (ref 1.5–4.5)
Glucose: 93 mg/dL (ref 65–99)
Potassium: 4.9 mmol/L (ref 3.5–5.2)
Sodium: 141 mmol/L (ref 134–144)
Total Protein: 7 g/dL (ref 6.0–8.5)
eGFR: 74 mL/min/{1.73_m2} (ref >59)

## 2020-06-24 LAB — LIPID PANEL
Chol/HDL Ratio: 2.6 ratio (ref 0.0–4.4)
Cholesterol, Total: 168 mg/dL (ref 100–199)
HDL: 65 mg/dL (ref >39)
LDL Chol Calc (NIH): 92 mg/dL (ref 0–99)
Triglycerides: 56 mg/dL (ref 0–149)
VLDL Cholesterol Cal: 11 mg/dL (ref 5–40)

## 2020-06-24 LAB — TSH: TSH: 3.49 u[IU]/mL (ref 0.450–4.500)

## 2020-08-16 ENCOUNTER — Ambulatory Visit: Payer: Self-pay

## 2020-08-16 DIAGNOSIS — R300 Dysuria: Secondary | ICD-10-CM | POA: Diagnosis not present

## 2020-08-16 DIAGNOSIS — N390 Urinary tract infection, site not specified: Secondary | ICD-10-CM | POA: Diagnosis not present

## 2020-08-16 NOTE — Telephone Encounter (Signed)
Reason for Disposition  Pain or burning with passing urine  Answer Assessment - Initial Assessment Questions 1. COLOR of URINE: "Describe the color of the urine."  (e.g., tea-colored, pink, red, blood clots, bloody)     Blood clots and blood when wiping 2. ONSET: "When did the bleeding start?"      This morning 3. EPISODES: "How many times has there been blood in the urine?" or "How many times today?"     2 4. PAIN with URINATION: "Is there any pain with passing your urine?" If Yes, ask: "How bad is the pain?"  (Scale 1-10; or mild, moderate, severe)    - MILD - complains slightly about urination hurting    - MODERATE - interferes with normal activities      - SEVERE - excruciating, unwilling or unable to urinate because of the pain      7 when voids 5. FEVER: "Do you have a fever?" If Yes, ask: "What is your temperature, how was it measured, and when did it start?"     No 6. ASSOCIATED SYMPTOMS: "Are you passing urine more frequently than usual?"     Yes, pressure 7. OTHER SYMPTOMS: "Do you have any other symptoms?" (e.g., back/flank pain, abdominal pain, vomiting)     No 8. PREGNANCY: "Is there any chance you are pregnant?" "When was your last menstrual period?"     No  Protocols used: Urine - Blood In-A-AH

## 2020-08-16 NOTE — Telephone Encounter (Signed)
Please call pt to schedule a same day appt.  KP

## 2020-08-16 NOTE — Telephone Encounter (Signed)
Pt. Reports having urinary frequency, bladder pressure for 3 days. This morning passed blood clots in urine and saw blood when she wipes. Happened x 2. No availability with PCP. Per Marchelle Folks in the practice, will send triage for review. PLease advise pt.

## 2020-08-16 NOTE — Telephone Encounter (Signed)
Patient will be seen in urgent care for treatment

## 2020-12-16 DIAGNOSIS — I7301 Raynaud's syndrome with gangrene: Secondary | ICD-10-CM | POA: Diagnosis not present

## 2020-12-16 DIAGNOSIS — Z23 Encounter for immunization: Secondary | ICD-10-CM | POA: Diagnosis not present

## 2020-12-16 DIAGNOSIS — R768 Other specified abnormal immunological findings in serum: Secondary | ICD-10-CM | POA: Diagnosis not present

## 2021-02-10 ENCOUNTER — Telehealth: Payer: Self-pay | Admitting: Internal Medicine

## 2021-02-10 DIAGNOSIS — Z780 Asymptomatic menopausal state: Secondary | ICD-10-CM

## 2021-02-10 NOTE — Telephone Encounter (Signed)
Requested medication (s) are due for refill today - expired Rx  Requested medication (s) are on the active medication list -yes  Future visit scheduled -yes  Last refill: 01/27/20 #28  2RF  Notes to clinic: Request RF: expired Rx  Requested Prescriptions  Pending Prescriptions Disp Refills   PREMPRO 0.625-2.5 MG tablet [Pharmacy Med Name: PREMPRO 0.625MG /2.5MG  TAB28 PEACH] 28 tablet 2    Sig: TAKE 1 TABLET BY MOUTH DAILY     OB/GYN:  Hormone Combinations Passed - 02/10/2021  8:19 AM      Passed - Mammogram is up-to-date per Health Maintenance      Passed - Last BP in normal range    BP Readings from Last 1 Encounters:  06/23/20 118/78          Passed - Valid encounter within last 12 months    Recent Outpatient Visits           7 months ago Annual physical exam   [facility] Reubin Milan, MD   11 months ago Mass of left breast   [facility] Reubin Milan, MD   1 year ago Annual physical exam   [facility] Reubin Milan, MD   2 years ago Menopause   [facility] Reubin Milan, MD       Future Appointments             In 4 months Judithann Graves Nyoka Cowden, MD [facility], [facility]               Requested Prescriptions  Pending Prescriptions Disp Refills   PREMPRO 0.625-2.5 MG tablet [Pharmacy Med Name: [facility] 0.625MG /2.5MG  TAB28 PEACH] 28 tablet 2    Sig: TAKE 1 TABLET BY MOUTH DAILY     OB/GYN:  Hormone Combinations Passed - 02/10/2021  8:19 AM      Passed - Mammogram is up-to-date per Health Maintenance      Passed - Last BP in normal range    BP Readings from Last 1 Encounters:  06/23/20 118/78          Passed - Valid encounter within last 12 months    Recent Outpatient Visits           7 months ago Annual physical exam   [facility] Reubin Milan, MD   11 months ago Mass of left breast   [facility] Reubin Milan, MD   1 year ago Annual physical exam   [facility] Reubin Milan, MD   2 years ago Menopause   [facility] Reubin Milan, MD       Future Appointments             In 4 months Judithann Graves Nyoka Cowden, MD [facility], [facility]

## 2021-02-11 NOTE — Telephone Encounter (Signed)
Pt called saying she tried to go off of the medication for a while but found that she really needs to be taking it.  That is why she asked for a refill.  CB#  [phone]

## 2021-02-14 ENCOUNTER — Other Ambulatory Visit: Payer: Self-pay | Admitting: Internal Medicine

## 2021-02-14 ENCOUNTER — Telehealth: Payer: Self-pay | Admitting: Internal Medicine

## 2021-02-14 DIAGNOSIS — Z780 Asymptomatic menopausal state: Secondary | ICD-10-CM

## 2021-02-14 MED ORDER — PREMPRO 0.625-2.5 MG PO TABS
1.0000 | ORAL_TABLET | Freq: Every day | ORAL | 5 refills | Status: DC
Start: 2021-02-14 — End: 2022-04-01

## 2021-02-14 NOTE — Telephone Encounter (Signed)
Pt calling back for an update regarding medication.

## 2021-02-14 NOTE — Telephone Encounter (Signed)
Please review.  KP

## 2021-02-14 NOTE — Telephone Encounter (Signed)
Copied from CRM [id number]. Topic: Referral - Status >> Feb 14, 2021  3:39 PM Elliot Gault wrote: Patient states [facility] is not in network and would like referral placed with a in network dermatology. Patient would like a follow up call when completed.

## 2021-02-14 NOTE — Telephone Encounter (Signed)
Called pt X2 phone was busy one time and phone would not ring the second time. Pt needs to call insurance to see where she can be seen. Pt can call back with who her insurance covers and let us know and we will place the referral.   [place] nurse may give results to patient if they return call to clinic, a CRM has been created.

## 2021-03-24 ENCOUNTER — Other Ambulatory Visit: Payer: Self-pay | Admitting: Internal Medicine

## 2021-03-24 DIAGNOSIS — Z1231 Encounter for screening mammogram for malignant neoplasm of breast: Secondary | ICD-10-CM

## 2021-04-11 ENCOUNTER — Other Ambulatory Visit: Payer: Self-pay

## 2021-04-11 ENCOUNTER — Ambulatory Visit
Admission: RE | Admit: 2021-04-11 | Discharge: 2021-04-11 | Disposition: A | Discharge status: Home / Self Care | Payer: BC Managed Care – PPO | Source: Ambulatory Visit | Attending: Internal Medicine | Admitting: Internal Medicine

## 2021-04-11 DIAGNOSIS — Z1231 Encounter for screening mammogram for malignant neoplasm of breast: Secondary | ICD-10-CM

## 2021-06-27 ENCOUNTER — Encounter: Payer: Self-pay | Admitting: Internal Medicine

## 2021-06-27 ENCOUNTER — Ambulatory Visit (INDEPENDENT_AMBULATORY_CARE_PROVIDER_SITE_OTHER): Payer: BC Managed Care – PPO | Admitting: Internal Medicine

## 2021-06-27 VITALS — BP 118/84 | HR 67 | Ht 64.0 in | Wt 150.0 lb

## 2021-06-27 DIAGNOSIS — M359 Systemic involvement of connective tissue, unspecified: Secondary | ICD-10-CM

## 2021-06-27 DIAGNOSIS — Z1159 Encounter for screening for other viral diseases: Secondary | ICD-10-CM

## 2021-06-27 DIAGNOSIS — Z0000 Encounter for general adult medical examination without abnormal findings: Secondary | ICD-10-CM | POA: Diagnosis not present

## 2021-06-27 DIAGNOSIS — R7309 Other abnormal glucose: Secondary | ICD-10-CM | POA: Diagnosis not present

## 2021-06-27 NOTE — Progress Notes (Signed)
Date:  06/27/2021   Name:  Donna Jackson   DOB:  03-May-1970   MRN:  [id number]   Chief Complaint: No chief complaint on file. Donna Jackson is a 51 y.o. female who presents today for her Complete Annual Exam. She feels well. She reports exercising [place] 30 min work out. She reports she is sleeping well. Breast complaints none.  Mammogram: 04/2021 DEXA: none Pap smear: 02/2019 neg with co-testing Colonoscopy: 02/2019  Health Maintenance Due  Topic Date Due   HIV Screening  Never done   Zoster Vaccines- Shingrix (1 of 2) Never done    Immunization History  Administered Date(s) Administered   Influenza,inj,Quad PF,6+ Mos 11/10/2014, 12/13/2017, 12/16/2018, 12/16/2019, 12/16/2020    HPI  Lab Results  Component Value Date   NA 141 06/23/2020   K 4.9 06/23/2020   CO2 24 06/23/2020   GLUCOSE 93 06/23/2020   BUN 13 06/23/2020   CREATININE 0.93 06/23/2020   CALCIUM 9.7 06/23/2020   EGFR 74 06/23/2020   GFRNONAA 74 02/24/2019   Lab Results  Component Value Date   CHOL 168 06/23/2020   HDL 65 06/23/2020   LDLCALC 92 06/23/2020   TRIG 56 06/23/2020   CHOLHDL 2.6 06/23/2020   Lab Results  Component Value Date   TSH 3.490 06/23/2020   No results found for: HGBA1C Lab Results  Component Value Date   WBC 7.1 06/23/2020   HGB 14.3 06/23/2020   HCT 43.2 06/23/2020   MCV 98 (H) 06/23/2020   PLT 258 06/23/2020   Lab Results  Component Value Date   ALT 11 06/23/2020   AST 21 06/23/2020   ALKPHOS 87 06/23/2020   BILITOT 0.4 06/23/2020   Lab Results  Component Value Date   VD25OH 38.2 11/04/2015     Review of Systems  Constitutional:  Positive for fatigue and unexpected weight change. Negative for chills and fever.  HENT:  Negative for congestion, hearing loss, nosebleeds, tinnitus, trouble swallowing and voice change.   Eyes:  Negative for visual disturbance.  Respiratory:  Negative for cough, chest tightness, shortness of breath and wheezing.    Cardiovascular:  Negative for chest pain, palpitations and leg swelling.  Gastrointestinal:  Negative for abdominal pain, constipation, diarrhea and vomiting.  Endocrine: Negative for polydipsia and polyuria.  Genitourinary:  Negative for dysuria, frequency, genital sores, vaginal bleeding and vaginal discharge.  Musculoskeletal:  Negative for arthralgias, gait problem and joint swelling.  Skin:  Positive for rash (on left chest after beach trip this weekend). Negative for color change.  Neurological:  Negative for dizziness, tremors, weakness, light-headedness and headaches.  Hematological:  Negative for adenopathy. Does not bruise/bleed easily.  Psychiatric/Behavioral:  Negative for dysphoric mood, sleep disturbance and suicidal ideas. The patient is nervous/anxious (feels slightly anxious at times but no panic attacks).    Patient Active Problem List   Diagnosis Date Noted   Raynaud's phenomenon without gangrene 12/27/2018   Anxiety 03/05/2015   Menopause 09/22/2014   History of nephrolithiasis 07/10/2014   Connective tissue disease, undifferentiated ([place]) 12/04/2013    Allergies  Allergen Reactions   Celecoxib Shortness Of Breath   Sulfa Antibiotics Shortness Of Breath   Amlodipine Other (See Comments)    "headaches"   Levofloxacin Other (See Comments)    Stomach upset    Past Surgical History:  Procedure Laterality Date   CESAREAN SECTION  1997   COLONOSCOPY WITH PROPOFOL N/A 02/14/2019   Procedure: COLONOSCOPY WITH PROPOFOL;  Surgeon: Lucilla Lame,  MD;  Location: [facility] ENDOSCOPY;  Service: Endoscopy;  Laterality: N/A;   DILATATION & CURRETTAGE/HYSTEROSCOPY WITH RESECTOCOPE N/A 11/23/2014   Procedure: DILATATION & CURETTAGE/HYSTEROSCOPY WITH RESECTOCOPE;  Surgeon: Brayton Mars, MD;  Location: [facility] ORS;  Service: Gynecology;  Laterality: N/A;   DILATION AND CURETTAGE OF UTERUS  1996   TONSILLECTOMY     TONSILLECTOMY AND ADENOIDECTOMY  1982    Social History    Tobacco Use   Smoking status: Never   Smokeless tobacco: Never  Vaping Use   Vaping Use: Never used  Substance Use Topics   Alcohol use: Yes    Comment: occas   Drug use: No     Medication list has been reviewed and updated.  Current Meds  Medication Sig   estrogen, conjugated,-medroxyprogesterone (PREMPRO) 0.625-2.5 MG tablet Take 1 tablet by mouth daily. (Patient taking differently: Take 1 tablet by mouth daily. M,W,F)   Multiple Vitamins-Minerals (MULTIVITAMIN WITH MINERALS) tablet Take 1 tablet by mouth daily.   Probiotic Product (PROBIOTIC-10 PO) Take by mouth.       06/27/2021    8:09 AM 06/23/2020    8:09 AM 02/17/2020    9:25 AM  GAD 7 : Generalized Anxiety Score  Nervous, Anxious, on Edge _0 Control/stop worrying 0 1 1  Worry too much - different things _1 Trouble relaxing 0 0 0  Restless 1 0 0  Easily annoyed or irritable 0 0 0  Afraid - awful might happen 0 0 0  Total GAD 7 Score _2 Anxiety Difficulty Not difficult at all Not difficult at all        06/27/2021    8:09 AM  Depression screen PHQ 2/9  Decreased Interest 0  Down, Depressed, Hopeless 1  PHQ - 2 Score 1  Altered sleeping 2  Tired, decreased energy 2  Change in appetite 0  Feeling bad or failure about yourself  0  Trouble concentrating 0  Moving slowly or fidgety/restless 0  Suicidal thoughts 0  PHQ-9 Score 5  Difficult doing work/chores Not difficult at all    BP Readings from Last 3 Encounters:  06/27/21 118/84  06/23/20 118/78  02/17/20 128/86    Physical Exam Vitals and nursing note reviewed.  Constitutional:      General: She is not in acute distress.    Appearance: She is well-developed.  HENT:     Head: Normocephalic and atraumatic.     Right Ear: Tympanic membrane and ear canal normal.     Left Ear: Tympanic membrane and ear canal normal.     Nose:     Right Sinus: No maxillary sinus tenderness.     Left Sinus: No maxillary sinus tenderness.  Eyes:      General: No scleral icterus.       Right eye: No discharge.        Left eye: No discharge.     Conjunctiva/sclera: Conjunctivae normal.  Neck:     Thyroid: No thyromegaly.     Vascular: No carotid bruit.  Cardiovascular:     Rate and Rhythm: Normal rate and regular rhythm.     Pulses: Normal pulses.     Heart sounds: Normal heart sounds.  Pulmonary:     Effort: Pulmonary effort is normal. No respiratory distress.     Breath sounds: No wheezing.  Chest:  Breasts:    Right: No mass, nipple discharge, skin change or tenderness.     Left: No mass,  nipple discharge, skin change or tenderness.  Abdominal:     General: Bowel sounds are normal.     Palpations: Abdomen is soft.     Tenderness: There is no abdominal tenderness.  Musculoskeletal:     Cervical back: Normal range of motion. No erythema.     Right lower leg: No edema.     Left lower leg: No edema.  Lymphadenopathy:     Cervical: No cervical adenopathy.  Skin:    General: Skin is warm and dry.     Capillary Refill: Capillary refill takes less than 2 seconds.     Findings: No rash.     Comments: Five red papules on left breast and chest c/w insect bites  Neurological:     General: No focal deficit present.     Mental Status: She is alert and oriented to person, place, and time.     Cranial Nerves: No cranial nerve deficit.     Sensory: No sensory deficit.     Deep Tendon Reflexes: Reflexes are normal and symmetric.  Psychiatric:        Attention and Perception: Attention normal.        Mood and Affect: Mood normal.    Wt Readings from Last 3 Encounters:  06/27/21 150 lb (68 kg)  06/23/20 138 lb (62.6 kg)  02/17/20 137 lb (62.1 kg)    BP 118/84   Pulse 67   Ht _0  (1.626 m)   Wt 150 lb (68 kg)   LMP 09/21/2014 (Exact Date)   SpO2 99%   BMI 25.75 kg/m   Assessment and Plan: 1. Annual physical exam Normal exam; continue healthy diet, resume exercise, WW Will get labs to rule anemia, thyroid disease,  etc - CBC with Differential/Platelet - Comprehensive metabolic panel - Hemoglobin A1c - Lipid panel - TSH  2. Need for hepatitis C screening test - Hepatitis C antibody  3. Connective tissue disease, undifferentiated () Followed by Rheumatology - on no medications currently   Partially dictated using [place]. Any errors are unintentional.  Halina Maidens, MD [facility]  06/27/2021

## 2021-06-28 LAB — CBC WITH DIFFERENTIAL/PLATELET
Basophils Absolute: 0.1 10*3/uL (ref 0.0–0.2)
Basos: 1 %
EOS (ABSOLUTE): 0.2 10*3/uL (ref 0.0–0.4)
Eos: 3 %
Hematocrit: 40.2 % (ref 34.0–46.6)
Hemoglobin: 13.6 g/dL (ref 11.1–15.9)
Immature Grans (Abs): 0 10*3/uL (ref 0.0–0.1)
Immature Granulocytes: 0 %
Lymphocytes Absolute: 1.8 10*3/uL (ref 0.7–3.1)
Lymphs: 36 %
MCH: 32.9 pg (ref 26.6–33.0)
MCHC: 33.8 g/dL (ref 31.5–35.7)
MCV: 97 fL (ref 79–97)
Monocytes Absolute: 0.6 10*3/uL (ref 0.1–0.9)
Monocytes: 12 %
Neutrophils Absolute: 2.3 10*3/uL (ref 1.4–7.0)
Neutrophils: 48 %
Platelets: 265 10*3/uL (ref 150–450)
RBC: 4.14 x10E6/uL (ref 3.77–5.28)
RDW: 12.5 % (ref 11.7–15.4)
WBC: 4.9 10*3/uL (ref 3.4–10.8)

## 2021-06-28 LAB — LIPID PANEL
Chol/HDL Ratio: 2.5 ratio (ref 0.0–4.4)
Cholesterol, Total: 171 mg/dL (ref 100–199)
HDL: 68 mg/dL (ref >39)
LDL Chol Calc (NIH): 92 mg/dL (ref 0–99)
Triglycerides: 53 mg/dL (ref 0–149)
VLDL Cholesterol Cal: 11 mg/dL (ref 5–40)

## 2021-06-28 LAB — COMPREHENSIVE METABOLIC PANEL
ALT: 16 IU/L (ref 0–32)
AST: 27 IU/L (ref 0–40)
Albumin/Globulin Ratio: 1.7 (ref 1.2–2.2)
Albumin: 4.3 g/dL (ref 3.8–4.9)
Alkaline Phosphatase: 85 IU/L (ref 44–121)
BUN/Creatinine Ratio: 15 (ref 9–23)
BUN: 12 mg/dL (ref 6–24)
Bilirubin Total: 0.2 mg/dL (ref 0.0–1.2)
CO2: 24 mmol/L (ref 20–29)
Calcium: 9.1 mg/dL (ref 8.7–10.2)
Chloride: 105 mmol/L (ref 96–106)
Creatinine, Ser: 0.81 mg/dL (ref 0.57–1.00)
Globulin, Total: 2.5 g/dL (ref 1.5–4.5)
Glucose: 98 mg/dL (ref 70–99)
Potassium: 4.8 mmol/L (ref 3.5–5.2)
Sodium: 144 mmol/L (ref 134–144)
Total Protein: 6.8 g/dL (ref 6.0–8.5)
eGFR: 87 mL/min/{1.73_m2} (ref >59)

## 2021-06-28 LAB — HEPATITIS C ANTIBODY: Hep C Virus Ab: NONREACTIVE

## 2021-06-28 LAB — HEMOGLOBIN A1C
Est. average glucose Bld gHb Est-mCnc: 103 mg/dL
Hgb A1c MFr Bld: 5.2 % (ref 4.8–5.6)

## 2021-06-28 LAB — TSH: TSH: 3.33 u[IU]/mL (ref 0.450–4.500)

## 2021-10-11 ENCOUNTER — Ambulatory Visit: Payer: BC Managed Care – PPO | Admitting: Internal Medicine

## 2021-10-11 ENCOUNTER — Encounter: Payer: Self-pay | Admitting: Internal Medicine

## 2021-10-11 VITALS — BP 124/88 | HR 84 | Ht 64.0 in | Wt 154.4 lb

## 2021-10-11 DIAGNOSIS — L03313 Cellulitis of chest wall: Secondary | ICD-10-CM | POA: Diagnosis not present

## 2021-10-11 MED ORDER — DOXYCYCLINE HYCLATE 100 MG PO TABS
100.0000 mg | ORAL_TABLET | Freq: Two times a day (BID) | ORAL | 0 refills | Status: AC
Start: 2021-10-11 — End: 2021-10-21

## 2021-10-11 NOTE — Progress Notes (Signed)
Date:  10/11/2021   Name:  Donna Jackson   DOB:  1970/07/13   MRN:  [id number]   Chief Complaint: Rash (Left Breast.)  Rash This is a new problem. Episode onset: 2 days ago while at the beach. The affected locations include the chest. The rash is characterized by redness (intially very red in 2 patches). Associated symptoms include fatigue and shortness of breath. Pertinent negatives include no fever, joint pain or sore throat. Past treatments include moisturizer. Improvement on treatment: the rash is fading but developed one small blister that ruptured.    Lab Results  Component Value Date   NA 144 06/27/2021   K 4.8 06/27/2021   CO2 24 06/27/2021   GLUCOSE 98 06/27/2021   BUN 12 06/27/2021   CREATININE 0.81 06/27/2021   CALCIUM 9.1 06/27/2021   EGFR 87 06/27/2021   GFRNONAA 74 02/24/2019   Lab Results  Component Value Date   CHOL 171 06/27/2021   HDL 68 06/27/2021   LDLCALC 92 06/27/2021   TRIG 53 06/27/2021   CHOLHDL 2.5 06/27/2021   Lab Results  Component Value Date   TSH 3.330 06/27/2021   Lab Results  Component Value Date   HGBA1C 5.2 06/27/2021   Lab Results  Component Value Date   WBC 4.9 06/27/2021   HGB 13.6 06/27/2021   HCT 40.2 06/27/2021   MCV 97 06/27/2021   PLT 265 06/27/2021   Lab Results  Component Value Date   ALT 16 06/27/2021   AST 27 06/27/2021   ALKPHOS 85 06/27/2021   BILITOT 0.2 06/27/2021   Lab Results  Component Value Date   VD25OH 38.2 11/04/2015     Review of Systems  Constitutional:  Positive for fatigue. Negative for fever.  HENT:  Negative for sore throat.   Respiratory:  Positive for shortness of breath.   Cardiovascular:  Negative for chest pain and palpitations.  Musculoskeletal:  Negative for joint pain.  Skin:  Positive for rash.  Neurological:  Negative for dizziness and headaches.    Patient Active Problem List   Diagnosis Date Noted   Raynaud's phenomenon without gangrene 12/27/2018   Anxiety  03/05/2015   Menopause 09/22/2014   History of nephrolithiasis 07/10/2014   Connective tissue disease, undifferentiated ([place]) 12/04/2013    Allergies  Allergen Reactions   Celecoxib Shortness Of Breath   Sulfa Antibiotics Shortness Of Breath   Amlodipine Other (See Comments)    "headaches"   Levofloxacin Other (See Comments)    Stomach upset    Past Surgical History:  Procedure Laterality Date   CESAREAN SECTION  1997   COLONOSCOPY WITH PROPOFOL N/A 02/14/2019   Procedure: COLONOSCOPY WITH PROPOFOL;  Surgeon: Lucilla Lame, MD;  Location: [facility] ENDOSCOPY;  Service: Endoscopy;  Laterality: N/A;   DILATATION & CURRETTAGE/HYSTEROSCOPY WITH RESECTOCOPE N/A 11/23/2014   Procedure: DILATATION & CURETTAGE/HYSTEROSCOPY WITH RESECTOCOPE;  Surgeon: Brayton Mars, MD;  Location: [facility] ORS;  Service: Gynecology;  Laterality: N/A;   DILATION AND CURETTAGE OF UTERUS  1996   TONSILLECTOMY     TONSILLECTOMY AND ADENOIDECTOMY  1982    Social History   Tobacco Use   Smoking status: Never   Smokeless tobacco: Never  Vaping Use   Vaping Use: Never used  Substance Use Topics   Alcohol use: Yes    Comment: occas   Drug use: No     Medication list has been reviewed and updated.  Current Meds  Medication Sig   estrogen, conjugated,-medroxyprogesterone (PREMPRO) 0.625-2.5  MG tablet Take 1 tablet by mouth daily. (Patient taking differently: Take 1 tablet by mouth daily. M,W,F)   Multiple Vitamins-Minerals (MULTIVITAMIN WITH MINERALS) tablet Take 1 tablet by mouth daily.   Probiotic Product (PROBIOTIC-10 PO) Take by mouth.       10/11/2021    4:02 PM 06/27/2021    8:09 AM 06/23/2020    8:09 AM 02/17/2020    9:25 AM  GAD 7 : Generalized Anxiety Score  Nervous, Anxious, on Edge _0 Control/stop worrying 0 0 1 1  Worry too much - different things 0 _1 Trouble relaxing 0 0 0 0  Restless 0 1 0 0  Easily annoyed or irritable 0 0 0 0  Afraid - awful might happen 0 0 0 0  Total  GAD 7 Score _2 Anxiety Difficulty Not difficult at all Not difficult at all Not difficult at all        10/11/2021    4:02 PM 06/27/2021    8:09 AM 06/23/2020    8:09 AM  Depression screen PHQ 2/9  Decreased Interest 0 0 0  Down, Depressed, Hopeless 0 1 0  PHQ - 2 Score 0 1 0  Altered sleeping 0 2 0  Tired, decreased energy 0 2 0  Change in appetite 0 0 0  Feeling bad or failure about yourself  0 0 0  Trouble concentrating 0 0 0  Moving slowly or fidgety/restless 0 0 0  Suicidal thoughts 0 0 0  PHQ-9 Score 0 5 0  Difficult doing work/chores Not difficult at all Not difficult at all Not difficult at all    BP Readings from Last 3 Encounters:  10/11/21 124/88  06/27/21 118/84  06/23/20 118/78    Physical Exam Vitals and nursing note reviewed.  Constitutional:      General: She is not in acute distress.    Appearance: Normal appearance. She is well-developed.  HENT:     Head: Normocephalic and atraumatic.  Cardiovascular:     Rate and Rhythm: Normal rate and regular rhythm.  Pulmonary:     Effort: Pulmonary effort is normal. No respiratory distress.     Breath sounds: No wheezing or rhonchi.  Chest:       Comments: Flat pink lesion with small ruptured vesicle in center Musculoskeletal:     Cervical back: Normal range of motion.  Lymphadenopathy:     Cervical: No cervical adenopathy.     Upper Body:     Right upper body: No supraclavicular or axillary adenopathy.     Left upper body: No supraclavicular or axillary adenopathy.  Skin:    General: Skin is warm and dry.     Findings: No rash.  Neurological:     Mental Status: She is alert and oriented to person, place, and time.  Psychiatric:        Mood and Affect: Mood normal.        Behavior: Behavior normal.     Wt Readings from Last 3 Encounters:  10/11/21 154 lb 6.4 oz (70 kg)  06/27/21 150 lb (68 kg)  06/23/20 138 lb (62.6 kg)    BP 124/88   Pulse 84   Ht _3  (1.626 m)   Wt 154 lb 6.4 oz (70  kg)   LMP 09/21/2014 (Exact Date)   SpO2 93%   BMI 26.50 kg/m   Assessment and Plan: 1. Cellulitis of chest wall Suspect this is from a  spider or other insect bite Low suspicion for Shingles Continue local care and follow up if worsening - doxycycline (VIBRA-TABS) 100 MG tablet; Take 1 tablet (100 mg total) by mouth 2 (two) times daily for 10 days.  Dispense: 20 tablet; Refill: 0   Partially dictated using [employer]. Any errors are unintentional.  Halina Maidens, MD [facility]  10/11/2021

## 2021-12-19 DIAGNOSIS — R768 Other specified abnormal immunological findings in serum: Secondary | ICD-10-CM | POA: Diagnosis not present

## 2021-12-19 DIAGNOSIS — Z23 Encounter for immunization: Secondary | ICD-10-CM | POA: Diagnosis not present

## 2021-12-19 DIAGNOSIS — I7301 Raynaud's syndrome with gangrene: Secondary | ICD-10-CM | POA: Diagnosis not present

## 2022-04-01 ENCOUNTER — Other Ambulatory Visit: Payer: Self-pay | Admitting: Internal Medicine

## 2022-04-01 DIAGNOSIS — Z780 Asymptomatic menopausal state: Secondary | ICD-10-CM

## 2022-04-19 ENCOUNTER — Other Ambulatory Visit: Payer: Self-pay | Admitting: Internal Medicine

## 2022-04-19 DIAGNOSIS — Z1231 Encounter for screening mammogram for malignant neoplasm of breast: Secondary | ICD-10-CM

## 2022-04-24 ENCOUNTER — Ambulatory Visit
Admission: RE | Admit: 2022-04-24 | Discharge: 2022-04-24 | Disposition: A | Discharge status: Home / Self Care | Payer: No Typology Code available for payment source | Source: Ambulatory Visit | Attending: Internal Medicine | Admitting: Internal Medicine

## 2022-04-24 DIAGNOSIS — Z1231 Encounter for screening mammogram for malignant neoplasm of breast: Secondary | ICD-10-CM | POA: Diagnosis not present

## 2022-06-29 ENCOUNTER — Encounter: Payer: Self-pay | Admitting: Internal Medicine

## 2022-06-29 ENCOUNTER — Ambulatory Visit (INDEPENDENT_AMBULATORY_CARE_PROVIDER_SITE_OTHER): Payer: 59 | Admitting: Internal Medicine

## 2022-06-29 VITALS — BP 118/76 | HR 76 | Ht 64.0 in | Wt 153.0 lb

## 2022-06-29 DIAGNOSIS — Z79890 Hormone replacement therapy (postmenopausal): Secondary | ICD-10-CM | POA: Diagnosis not present

## 2022-06-29 DIAGNOSIS — M359 Systemic involvement of connective tissue, unspecified: Secondary | ICD-10-CM

## 2022-06-29 DIAGNOSIS — Z0000 Encounter for general adult medical examination without abnormal findings: Secondary | ICD-10-CM

## 2022-06-29 DIAGNOSIS — F418 Other specified anxiety disorders: Secondary | ICD-10-CM | POA: Diagnosis not present

## 2022-06-29 MED ORDER — PREMPRO 0.625-2.5 MG PO TABS: 1.0000 | ORAL_TABLET | ORAL | 5 refills | Status: DC

## 2022-06-29 MED ORDER — ALPRAZOLAM 0.5 MG PO TABS: 0.2500 mg | ORAL_TABLET | Freq: Every day | ORAL | 0 refills | Status: DC | PRN

## 2022-06-29 NOTE — Assessment & Plan Note (Signed)
Followed by rheumatology. Last visit 11/2021 - symptoms stable and no medications prescribed.

## 2022-06-29 NOTE — Assessment & Plan Note (Signed)
Intermittent symptoms mostly related to highway travel. She goes to the beach every weekend and has started to dread it. Her husband drives so she could take something as needed.

## 2022-06-29 NOTE — Progress Notes (Signed)
Date:  06/29/2022   Name:  Donna Jackson   DOB:  Aug 05, 1970   MRN:  [id number]   Chief Complaint: Annual Exam Donna Jackson is a 52 y.o. female who presents today for her Complete Annual Exam. She feels well. She reports exercising. She reports she is sleeping poorly. Breast complaints - none.  Mammogram: 04/2022 DEXA: none Pap smear: 02/2019 neg/neg Colonoscopy: 02/2019 repeat 10 yrs  Health Maintenance Due  Topic Date Due   COVID-19 Vaccine (1) Never done   HIV Screening  Never done   DTaP/Tdap/Td (1 - Tdap) Never done   Zoster Vaccines- Shingrix (1 of 2) Never done    Immunization History  Administered Date(s) Administered   Influenza Inj Mdck Quad Pf 12/13/2017, 12/16/2018, 12/16/2020, 12/19/2021   Influenza,inj,Quad PF,6+ Mos 11/10/2014, 12/13/2017, 12/16/2018, 12/16/2019, 12/16/2020    Anxiety Presents for follow-up visit. Symptoms include excessive worry and insomnia. Patient reports no chest pain, dizziness, nervous/anxious behavior, palpitations or shortness of breath.      Lab Results  Component Value Date   NA 144 06/27/2021   K 4.8 06/27/2021   CO2 24 06/27/2021   GLUCOSE 98 06/27/2021   BUN 12 06/27/2021   CREATININE 0.81 06/27/2021   CALCIUM 9.1 06/27/2021   EGFR 87 06/27/2021   GFRNONAA 74 02/24/2019   Lab Results  Component Value Date   CHOL 171 06/27/2021   HDL 68 06/27/2021   LDLCALC 92 06/27/2021   TRIG 53 06/27/2021   CHOLHDL 2.5 06/27/2021   Lab Results  Component Value Date   TSH 3.330 06/27/2021   Lab Results  Component Value Date   HGBA1C 5.2 06/27/2021   Lab Results  Component Value Date   WBC 4.9 06/27/2021   HGB 13.6 06/27/2021   HCT 40.2 06/27/2021   MCV 97 06/27/2021   PLT 265 06/27/2021   Lab Results  Component Value Date   ALT 16 06/27/2021   AST 27 06/27/2021   ALKPHOS 85 06/27/2021   BILITOT 0.2 06/27/2021   Lab Results  Component Value Date   VD25OH 38.2 11/04/2015     Review of Systems   Constitutional:  Negative for chills, fatigue and fever.  HENT:  Negative for congestion, hearing loss, tinnitus, trouble swallowing and voice change.   Eyes:  Negative for visual disturbance.  Respiratory:  Negative for cough, chest tightness, shortness of breath and wheezing.   Cardiovascular:  Negative for chest pain, palpitations and leg swelling.  Gastrointestinal:  Negative for abdominal pain, constipation, diarrhea and vomiting.  Endocrine: Negative for polydipsia and polyuria.  Genitourinary:  Negative for dysuria, frequency, genital sores, vaginal bleeding and vaginal discharge.  Musculoskeletal:  Negative for arthralgias, gait problem and joint swelling.  Skin:  Negative for color change and rash.  Neurological:  Negative for dizziness, tremors, light-headedness and headaches.  Hematological:  Negative for adenopathy. Does not bruise/bleed easily.  Psychiatric/Behavioral:  Negative for dysphoric mood and sleep disturbance. The patient has insomnia. The patient is not nervous/anxious.     Patient Active Problem List   Diagnosis Date Noted   Situational anxiety 06/29/2022   Raynaud's phenomenon without gangrene 12/27/2018   Post-menopause on HRT (hormone replacement therapy) 09/22/2014   History of nephrolithiasis 07/10/2014   Connective tissue disease, undifferentiated (HCC) 12/04/2013    Allergies  Allergen Reactions   Celecoxib Shortness Of Breath   Sulfa Antibiotics Shortness Of Breath   Amlodipine Other (See Comments)    "headaches"   Levofloxacin Other (See Comments)  Stomach upset    Past Surgical History:  Procedure Laterality Date   CESAREAN SECTION  1997   COLONOSCOPY WITH PROPOFOL N/A 02/14/2019   Procedure: COLONOSCOPY WITH PROPOFOL;  Surgeon: Midge Minium, MD;  Location: [facility] ENDOSCOPY;  Service: Endoscopy;  Laterality: N/A;   DILATATION & CURRETTAGE/HYSTEROSCOPY WITH RESECTOCOPE N/A 11/23/2014   Procedure: DILATATION & CURETTAGE/HYSTEROSCOPY WITH  RESECTOCOPE;  Surgeon: Herold Harms, MD;  Location: [facility] ORS;  Service: Gynecology;  Laterality: N/A;   DILATION AND CURETTAGE OF UTERUS  1996   TONSILLECTOMY     TONSILLECTOMY AND ADENOIDECTOMY  1982    Social History   Tobacco Use   Smoking status: Never   Smokeless tobacco: Never  Vaping Use   Vaping Use: Never used  Substance Use Topics   Alcohol use: Yes    Comment: occas   Drug use: No     Medication list has been reviewed and updated.  Current Meds  Medication Sig   ALPRAZolam (XANAX) 0.5 MG tablet Take 0.5 tablets (0.25 mg total) by mouth daily as needed for anxiety.   Multiple Vitamins-Minerals (MULTIVITAMIN WITH MINERALS) tablet Take 1 tablet by mouth daily.   Probiotic Product (PROBIOTIC-10 PO) Take by mouth.   [DISCONTINUED] PREMPRO 0.625-2.5 MG tablet TAKE 1 TABLET BY MOUTH DAILY       06/29/2022    8:19 AM 10/11/2021    4:02 PM 06/27/2021    8:09 AM 06/23/2020    8:09 AM  GAD 7 : Generalized Anxiety Score  Nervous, Anxious, on Edge 2 1 1 1   Control/stop worrying 2 0 0 1  Worry too much - different things 2 0 1 1  Trouble relaxing 1 0 0 0  Restless 1 0 1 0  Easily annoyed or irritable 2 0 0 0  Afraid - awful might happen 0 0 0 0  Total GAD 7 Score 10 1 3 3   Anxiety Difficulty Somewhat difficult Not difficult at all Not difficult at all Not difficult at all       06/29/2022    8:18 AM 10/11/2021    4:02 PM 06/27/2021    8:09 AM  Depression screen PHQ 2/9  Decreased Interest 0 0 0  Down, Depressed, Hopeless 0 0 1  PHQ - 2 Score 0 0 1  Altered sleeping 2 0 2  Tired, decreased energy 1 0 2  Change in appetite 1 0 0  Feeling bad or failure about yourself  0 0 0  Trouble concentrating 2 0 0  Moving slowly or fidgety/restless 0 0 0  Suicidal thoughts 0 0 0  PHQ-9 Score 6 0 5  Difficult doing work/chores Somewhat difficult Not difficult at all Not difficult at all    BP Readings from Last 3 Encounters:  06/29/22 118/76  10/11/21 124/88   06/27/21 118/84    Physical Exam Vitals and nursing note reviewed.  Constitutional:      General: She is not in acute distress.    Appearance: She is well-developed.  HENT:     Head: Normocephalic and atraumatic.     Right Ear: Tympanic membrane and ear canal normal.     Left Ear: Tympanic membrane and ear canal normal.     Nose:     Right Sinus: No maxillary sinus tenderness.     Left Sinus: No maxillary sinus tenderness.  Eyes:     General: No scleral icterus.       Right eye: No discharge.  Left eye: No discharge.     Conjunctiva/sclera: Conjunctivae normal.  Neck:     Thyroid: No thyromegaly.     Vascular: No carotid bruit.  Cardiovascular:     Rate and Rhythm: Normal rate and regular rhythm.     Pulses: Normal pulses.     Heart sounds: Normal heart sounds.  Pulmonary:     Effort: Pulmonary effort is normal. No respiratory distress.     Breath sounds: No wheezing.  Chest:  Breasts:    Right: No mass, nipple discharge, skin change or tenderness.     Left: No mass, nipple discharge, skin change or tenderness.  Abdominal:     General: Bowel sounds are normal.     Palpations: Abdomen is soft.     Tenderness: There is no abdominal tenderness.  Musculoskeletal:     Cervical back: Normal range of motion. No erythema.     Right lower leg: No edema.     Left lower leg: No edema.  Lymphadenopathy:     Cervical: No cervical adenopathy.  Skin:    General: Skin is warm and dry.     Findings: No rash.  Neurological:     Mental Status: She is alert and oriented to person, place, and time.     Cranial Nerves: No cranial nerve deficit.     Sensory: No sensory deficit.     Deep Tendon Reflexes: Reflexes are normal and symmetric.  Psychiatric:        Attention and Perception: Attention normal.        Mood and Affect: Mood normal.     Wt Readings from Last 3 Encounters:  06/29/22 153 lb (69.4 kg)  10/11/21 154 lb 6.4 oz (70 kg)  06/27/21 150 lb (68 kg)    BP  118/76   Pulse 76   Ht 5\' 4"  (1.626 m)   Wt 153 lb (69.4 kg)   LMP 09/21/2014 (Exact Date)   SpO2 99%   BMI 26.26 kg/m   Assessment and Plan:  Problem List Items Addressed This Visit     Situational anxiety    Intermittent symptoms mostly related to highway travel. She goes to the beach every weekend and has started to dread it. Her husband drives so she could take something as needed.      Relevant Medications   ALPRAZolam (XANAX) 0.5 MG tablet   Post-menopause on HRT (hormone replacement therapy)    Doing well on HRT.       Relevant Medications   estrogen, conjugated,-medroxyprogesterone (PREMPRO) 0.625-2.5 MG tablet   Other Relevant Orders   CBC with Differential/Platelet   Comprehensive metabolic panel   Lipid panel   Connective tissue disease, undifferentiated (HCC) (Chronic)    Followed by rheumatology. Last visit 11/2021 - symptoms stable and no medications prescribed.      Relevant Orders   CBC with Differential/Platelet   Comprehensive metabolic panel   Other Visit Diagnoses     Annual physical exam    -  Primary   Relevant Orders   CBC with Differential/Platelet   Comprehensive metabolic panel   Lipid panel   TSH       No follow-ups on file.   Partially dictated using Dragon software, any errors are not intentional.  Reubin Milan, MD [facility] [place], [place]

## 2022-06-29 NOTE — Assessment & Plan Note (Signed)
Doing well on HRT

## 2022-06-30 LAB — CBC WITH DIFFERENTIAL/PLATELET
Basophils Absolute: 0.1 10*3/uL (ref 0.0–0.2)
Basos: 1 %
EOS (ABSOLUTE): 0.2 10*3/uL (ref 0.0–0.4)
Eos: 3 %
Hematocrit: 41.3 % (ref 34.0–46.6)
Hemoglobin: 13.8 g/dL (ref 11.1–15.9)
Immature Grans (Abs): 0 10*3/uL (ref 0.0–0.1)
Immature Granulocytes: 0 %
Lymphocytes Absolute: 1.9 10*3/uL (ref 0.7–3.1)
Lymphs: 33 %
MCH: 32.8 pg (ref 26.6–33.0)
MCHC: 33.4 g/dL (ref 31.5–35.7)
MCV: 98 fL — ABNORMAL HIGH (ref 79–97)
Monocytes Absolute: 0.7 10*3/uL (ref 0.1–0.9)
Monocytes: 11 %
Neutrophils Absolute: 3 10*3/uL (ref 1.4–7.0)
Neutrophils: 52 %
Platelets: 265 10*3/uL (ref 150–450)
RBC: 4.21 x10E6/uL (ref 3.77–5.28)
RDW: 11.7 % (ref 11.7–15.4)
WBC: 5.9 10*3/uL (ref 3.4–10.8)

## 2022-06-30 LAB — LIPID PANEL
Chol/HDL Ratio: 3.1 ratio (ref 0.0–4.4)
Cholesterol, Total: 179 mg/dL (ref 100–199)
HDL: 58 mg/dL (ref >39)
LDL Chol Calc (NIH): 105 mg/dL — ABNORMAL HIGH (ref 0–99)
Triglycerides: 89 mg/dL (ref 0–149)
VLDL Cholesterol Cal: 16 mg/dL (ref 5–40)

## 2022-06-30 LAB — COMPREHENSIVE METABOLIC PANEL
ALT: 17 IU/L (ref 0–32)
AST: 22 IU/L (ref 0–40)
Albumin/Globulin Ratio: 1.8 (ref 1.2–2.2)
Albumin: 4.4 g/dL (ref 3.8–4.9)
Alkaline Phosphatase: 89 IU/L (ref 44–121)
BUN/Creatinine Ratio: 12 (ref 9–23)
BUN: 11 mg/dL (ref 6–24)
Bilirubin Total: 0.2 mg/dL (ref 0.0–1.2)
CO2: 22 mmol/L (ref 20–29)
Calcium: 9.1 mg/dL (ref 8.7–10.2)
Chloride: 106 mmol/L (ref 96–106)
Creatinine, Ser: 0.94 mg/dL (ref 0.57–1.00)
Globulin, Total: 2.5 g/dL (ref 1.5–4.5)
Glucose: 92 mg/dL (ref 70–99)
Potassium: 4.4 mmol/L (ref 3.5–5.2)
Sodium: 143 mmol/L (ref 134–144)
Total Protein: 6.9 g/dL (ref 6.0–8.5)
eGFR: 72 mL/min/{1.73_m2} (ref >59)

## 2022-06-30 LAB — TSH: TSH: 4.57 u[IU]/mL — ABNORMAL HIGH (ref 0.450–4.500)

## 2023-03-21 LAB — CBC AND DIFFERENTIAL
HCT: 42 (ref 36–46)
Hemoglobin: 14.2 (ref 12.0–16.0)
Platelets: 279 10*3/uL (ref 150–400)
WBC: 6.4

## 2023-03-21 LAB — HEPATIC FUNCTION PANEL
ALT: 16 U/L (ref 7–35)
AST: 24 (ref 13–35)
Alkaline Phosphatase: 76 (ref 25–125)
Bilirubin, Total: 0.4

## 2023-04-25 ENCOUNTER — Other Ambulatory Visit: Payer: Self-pay | Admitting: Internal Medicine

## 2023-04-25 DIAGNOSIS — Z1231 Encounter for screening mammogram for malignant neoplasm of breast: Secondary | ICD-10-CM

## 2023-04-26 ENCOUNTER — Ambulatory Visit
Admission: RE | Admit: 2023-04-26 | Discharge: 2023-04-26 | Disposition: A | Discharge status: Home / Self Care | Payer: Self-pay | Source: Ambulatory Visit | Attending: Internal Medicine | Admitting: Internal Medicine

## 2023-04-26 DIAGNOSIS — Z1231 Encounter for screening mammogram for malignant neoplasm of breast: Secondary | ICD-10-CM | POA: Insufficient documentation

## 2023-05-29 ENCOUNTER — Encounter: Payer: Self-pay | Admitting: Internal Medicine

## 2023-05-29 ENCOUNTER — Ambulatory Visit: Admitting: Internal Medicine

## 2023-05-29 VITALS — BP 124/76 | HR 74 | Ht 64.0 in | Wt 150.4 lb

## 2023-05-29 DIAGNOSIS — K625 Hemorrhage of anus and rectum: Secondary | ICD-10-CM | POA: Diagnosis not present

## 2023-05-29 MED ORDER — HYDROCORTISONE (PERIANAL) 2.5 % EX CREA: 1.0000 | TOPICAL_CREAM | Freq: Two times a day (BID) | CUTANEOUS | 1 refills | Status: AC

## 2023-05-29 NOTE — Progress Notes (Signed)
 Date:  05/29/2023   Name:  Donna Jackson   DOB:  12/27/1970   MRN:  [id number]   Chief Complaint: Rectal Bleeding (Has noticed that it happens when she walks a lot, noticed on her underwear, last night noticed it a lot on the toilet paper, first noticed it a while back but more frequent now)  Rectal Bleeding  The onset is undetermined. The problem occurs frequently. The patient is experiencing no pain. The stool is described as soft. There was no prior successful therapy. Associated symptoms include hemorrhoids.  She has noticed scant bleeding after walking for a long time but last night there was a larger amount of bright red blood on the tissue.  No bleeding today.  No pain or itching. Colonoscopy showed internal hemorrhoids in 2021.  Review of Systems  Gastrointestinal:  Positive for hematochezia and hemorrhoids.     Lab Results  Component Value Date   NA 143 06/29/2022   K 4.4 06/29/2022   CO2 22 06/29/2022   GLUCOSE 92 06/29/2022   BUN 11 06/29/2022   CREATININE 0.94 06/29/2022   CALCIUM 9.1 06/29/2022   EGFR 72 06/29/2022   GFRNONAA 74 02/24/2019   Lab Results  Component Value Date   CHOL 179 06/29/2022   HDL 58 06/29/2022   LDLCALC 105 (H) 06/29/2022   TRIG 89 06/29/2022   CHOLHDL 3.1 06/29/2022   Lab Results  Component Value Date   TSH 4.570 (H) 06/29/2022   Lab Results  Component Value Date   HGBA1C 5.2 06/27/2021   Lab Results  Component Value Date   WBC 5.9 06/29/2022   HGB 13.8 06/29/2022   HCT 41.3 06/29/2022   MCV 98 (H) 06/29/2022   PLT 265 06/29/2022   Lab Results  Component Value Date   ALT 17 06/29/2022   AST 22 06/29/2022   ALKPHOS 89 06/29/2022   BILITOT 0.2 06/29/2022   Lab Results  Component Value Date   VD25OH 38.2 11/04/2015     Patient Active Problem List   Diagnosis Date Noted   Situational anxiety 06/29/2022   Raynaud's phenomenon without gangrene 12/27/2018   Post-menopause on HRT (hormone replacement therapy)  09/22/2014   History of nephrolithiasis 07/10/2014   Connective tissue disease, undifferentiated (HCC) 12/04/2013    Allergies  Allergen Reactions   Celecoxib Shortness Of Breath   Sulfa Antibiotics Shortness Of Breath   Amlodipine Other (See Comments)    "headaches"   Levofloxacin Other (See Comments)    Stomach upset    Past Surgical History:  Procedure Laterality Date   CESAREAN SECTION  1997   COLONOSCOPY WITH PROPOFOL  N/A 02/14/2019   Procedure: COLONOSCOPY WITH PROPOFOL ;  Surgeon: Marnee Sink, MD;  Location: [facility] ENDOSCOPY;  Service: Endoscopy;  Laterality: N/A;   DILATATION & CURRETTAGE/HYSTEROSCOPY WITH RESECTOCOPE N/A 11/23/2014   Procedure: DILATATION & CURETTAGE/HYSTEROSCOPY WITH RESECTOCOPE;  Surgeon: Colan Dash, MD;  Location: [facility] ORS;  Service: Gynecology;  Laterality: N/A;   DILATION AND CURETTAGE OF UTERUS  1996   TONSILLECTOMY     TONSILLECTOMY AND ADENOIDECTOMY  1982    Social History   Tobacco Use   Smoking status: Never   Smokeless tobacco: Never  Vaping Use   Vaping status: Never Used  Substance Use Topics   Alcohol use: Yes    Comment: occas   Drug use: No     Medication list has been reviewed and updated.  Current Meds  Medication Sig   ALPRAZolam  (XANAX ) 0.5 MG tablet  Take 0.5 tablets (0.25 mg total) by mouth daily as needed for anxiety.   estrogen, conjugated,-medroxyprogesterone  (PREMPRO ) 0.625-2.5 MG tablet Take 1 tablet by mouth every other day.   hydrocortisone  (ANUSOL -HC) 2.5 % rectal cream Place 1 Application rectally 2 (two) times daily.   Multiple Vitamins-Minerals (MULTIVITAMIN WITH MINERALS) tablet Take 1 tablet by mouth daily.   Probiotic Product (PROBIOTIC-10 PO) Take by mouth.       06/29/2022    8:19 AM 10/11/2021    4:02 PM 06/27/2021    8:09 AM 06/23/2020    8:09 AM  GAD 7 : Generalized Anxiety Score  Nervous, Anxious, on Edge 2 1 1 1   Control/stop worrying 2 0 0 1  Worry too much - different things 2 0 1 1   Trouble relaxing 1 0 0 0  Restless 1 0 1 0  Easily annoyed or irritable 2 0 0 0  Afraid - awful might happen 0 0 0 0  Total GAD 7 Score 10 1 3 3   Anxiety Difficulty Somewhat difficult Not difficult at all Not difficult at all Not difficult at all       06/29/2022    8:18 AM 10/11/2021    4:02 PM 06/27/2021    8:09 AM  Depression screen PHQ 2/9  Decreased Interest 0 0 0  Down, Depressed, Hopeless 0 0 1  PHQ - 2 Score 0 0 1  Altered sleeping 2 0 2  Tired, decreased energy 1 0 2  Change in appetite 1 0 0  Feeling bad or failure about yourself  0 0 0  Trouble concentrating 2 0 0  Moving slowly or fidgety/restless 0 0 0  Suicidal thoughts 0 0 0  PHQ-9 Score 6 0 5  Difficult doing work/chores Somewhat difficult Not difficult at all Not difficult at all    BP Readings from Last 3 Encounters:  05/29/23 124/76  06/29/22 118/76  10/11/21 124/88    Physical Exam Vitals and nursing note reviewed.  Constitutional:      General: She is not in acute distress.    Appearance: She is well-developed.  HENT:     Head: Normocephalic and atraumatic.  Cardiovascular:     Rate and Rhythm: Normal rate and regular rhythm.  Pulmonary:     Effort: Pulmonary effort is normal. No respiratory distress.  Abdominal:     General: Abdomen is flat.     Palpations: Abdomen is soft. There is no mass.     Tenderness: There is no abdominal tenderness. There is no guarding.  Genitourinary:    Rectum: Guaiac result negative. External hemorrhoid (tag) present.       Comments: Small soft skin tag/DRE without mass or pain  Skin:    General: Skin is warm and dry.     Findings: No rash.  Neurological:     Mental Status: She is alert and oriented to person, place, and time.  Psychiatric:        Mood and Affect: Mood normal.        Behavior: Behavior normal.     Wt Readings from Last 3 Encounters:  05/29/23 150 lb 6 oz (68.2 kg)  06/29/22 153 lb (69.4 kg)  10/11/21 154 lb 6.4 oz (70 kg)    BP  124/76   Pulse 74   Ht 5\' 4"  (1.626 m)   Wt 150 lb 6 oz (68.2 kg)   LMP 09/21/2014 (Exact Date)   SpO2 98%   BMI 25.81 kg/m   Assessment and  Plan:  Problem List Items Addressed This Visit   None Visit Diagnoses       Rectal bleeding    -  Primary   suspect friction from walking 2-4 miles daily use topical steroid bid x 1 wk then qhs prn   Relevant Medications   hydrocortisone  (ANUSOL -HC) 2.5 % rectal cream       No follow-ups on file.    Sheron Dixons, MD [facility] [place]

## 2023-06-01 ENCOUNTER — Encounter: Payer: Self-pay | Admitting: Internal Medicine

## 2023-06-01 ENCOUNTER — Other Ambulatory Visit: Payer: Self-pay

## 2023-06-01 ENCOUNTER — Ambulatory Visit: Payer: Self-pay

## 2023-06-01 ENCOUNTER — Ambulatory Visit: Admitting: Internal Medicine

## 2023-06-01 VITALS — BP 126/86 | HR 90 | Ht 64.0 in | Wt 147.1 lb

## 2023-06-01 DIAGNOSIS — R0781 Pleurodynia: Secondary | ICD-10-CM

## 2023-06-01 DIAGNOSIS — R079 Chest pain, unspecified: Secondary | ICD-10-CM

## 2023-06-01 MED ORDER — PREDNISONE 10 MG PO TABS: ORAL_TABLET | ORAL | 0 refills | Status: AC

## 2023-06-01 NOTE — Progress Notes (Signed)
 Date:  06/01/2023   Name:  Donna Jackson   DOB:  1970-09-12   MRN:  [id number]   Chief Complaint: Chest Pain (Stated at 2:00 am today, took a tums at 2:30 am thinking it was hear burn, feels the pain when taking a deep breath, pain also in neck & upper left shoulder, patient said it feels like stabbing pain, lasting couple seconds)  Chest Pain  This is a new problem. The current episode started yesterday. The onset quality is sudden. The pain is present in the lateral region. The pain is moderate. The quality of the pain is described as stabbing and tearing. Pertinent negatives include no cough, dizziness, fever, headaches, shortness of breath or vomiting.    Review of Systems  Constitutional:  Positive for fatigue. Negative for chills and fever.  HENT:  Negative for congestion and sore throat.   Respiratory:  Negative for cough, chest tightness, shortness of breath and wheezing.   Cardiovascular:  Positive for chest pain.  Gastrointestinal:  Negative for constipation and vomiting.  Neurological:  Negative for dizziness, light-headedness and headaches.  Psychiatric/Behavioral:  Negative for dysphoric mood and sleep disturbance. The patient is not nervous/anxious.      Lab Results  Component Value Date   NA 143 06/29/2022   K 4.4 06/29/2022   CO2 22 06/29/2022   GLUCOSE 92 06/29/2022   BUN 11 06/29/2022   CREATININE 0.94 06/29/2022   CALCIUM 9.1 06/29/2022   EGFR 72 06/29/2022   GFRNONAA 74 02/24/2019   Lab Results  Component Value Date   CHOL 179 06/29/2022   HDL 58 06/29/2022   LDLCALC 105 (H) 06/29/2022   TRIG 89 06/29/2022   CHOLHDL 3.1 06/29/2022   Lab Results  Component Value Date   TSH 4.570 (H) 06/29/2022   Lab Results  Component Value Date   HGBA1C 5.2 06/27/2021   Lab Results  Component Value Date   WBC 5.9 06/29/2022   HGB 13.8 06/29/2022   HCT 41.3 06/29/2022   MCV 98 (H) 06/29/2022   PLT 265 06/29/2022   Lab Results  Component Value Date    ALT 17 06/29/2022   AST 22 06/29/2022   ALKPHOS 89 06/29/2022   BILITOT 0.2 06/29/2022   Lab Results  Component Value Date   VD25OH 38.2 11/04/2015     Patient Active Problem List   Diagnosis Date Noted   Situational anxiety 06/29/2022   Raynaud's phenomenon without gangrene 12/27/2018   Post-menopause on HRT (hormone replacement therapy) 09/22/2014   History of nephrolithiasis 07/10/2014   Connective tissue disease, undifferentiated (HCC) 12/04/2013    Allergies  Allergen Reactions   Celecoxib Shortness Of Breath   Sulfa Antibiotics Shortness Of Breath   Amlodipine Other (See Comments)    "headaches"   Levofloxacin Other (See Comments)    Stomach upset    Past Surgical History:  Procedure Laterality Date   CESAREAN SECTION  1997   COLONOSCOPY WITH PROPOFOL  N/A 02/14/2019   Procedure: COLONOSCOPY WITH PROPOFOL ;  Surgeon: Marnee Sink, MD;  Location: [facility] ENDOSCOPY;  Service: Endoscopy;  Laterality: N/A;   DILATATION & CURRETTAGE/HYSTEROSCOPY WITH RESECTOCOPE N/A 11/23/2014   Procedure: DILATATION & CURETTAGE/HYSTEROSCOPY WITH RESECTOCOPE;  Surgeon: Colan Dash, MD;  Location: [facility] ORS;  Service: Gynecology;  Laterality: N/A;   DILATION AND CURETTAGE OF UTERUS  1996   TONSILLECTOMY     TONSILLECTOMY AND ADENOIDECTOMY  1982    Social History   Tobacco Use   Smoking status: Never  Smokeless tobacco: Never  Vaping Use   Vaping status: Never Used  Substance Use Topics   Alcohol use: Yes    Comment: occas   Drug use: No     Medication list has been reviewed and updated.  Current Meds  Medication Sig   ALPRAZolam  (XANAX ) 0.5 MG tablet Take 0.5 tablets (0.25 mg total) by mouth daily as needed for anxiety.   estrogen, conjugated,-medroxyprogesterone  (PREMPRO ) 0.625-2.5 MG tablet Take 1 tablet by mouth every other day.   hydrocortisone  (ANUSOL -HC) 2.5 % rectal cream Place 1 Application rectally 2 (two) times daily.   Multiple Vitamins-Minerals (MULTIVITAMIN  WITH MINERALS) tablet Take 1 tablet by mouth daily.   predniSONE (DELTASONE) 10 MG tablet Take 6 tablets (60 mg total) by mouth daily with breakfast for 1 day, THEN 5 tablets (50 mg total) daily with breakfast for 1 day, THEN 4 tablets (40 mg total) daily with breakfast for 1 day, THEN 3 tablets (30 mg total) daily with breakfast for 1 day, THEN 2 tablets (20 mg total) daily with breakfast for 1 day, THEN 1 tablet (10 mg total) daily with breakfast for 1 day.   Probiotic Product (PROBIOTIC-10 PO) Take by mouth.       06/29/2022    8:19 AM 10/11/2021    4:02 PM 06/27/2021    8:09 AM 06/23/2020    8:09 AM  GAD 7 : Generalized Anxiety Score  Nervous, Anxious, on Edge 2 1 1 1   Control/stop worrying 2 0 0 1  Worry too much - different things 2 0 1 1  Trouble relaxing 1 0 0 0  Restless 1 0 1 0  Easily annoyed or irritable 2 0 0 0  Afraid - awful might happen 0 0 0 0  Total GAD 7 Score 10 1 3 3   Anxiety Difficulty Somewhat difficult Not difficult at all Not difficult at all Not difficult at all       06/01/2023   11:37 AM 06/29/2022    8:18 AM 10/11/2021    4:02 PM  Depression screen PHQ 2/9  Decreased Interest 0 0 0  Down, Depressed, Hopeless 0 0 0  PHQ - 2 Score 0 0 0  Altered sleeping  2 0  Tired, decreased energy  1 0  Change in appetite  1 0  Feeling bad or failure about yourself   0 0  Trouble concentrating  2 0  Moving slowly or fidgety/restless  0 0  Suicidal thoughts  0 0  PHQ-9 Score  6 0  Difficult doing work/chores  Somewhat difficult Not difficult at all    BP Readings from Last 3 Encounters:  06/01/23 126/86  05/29/23 124/76  06/29/22 118/76    Physical Exam Vitals and nursing note reviewed.  Constitutional:      General: She is not in acute distress.    Appearance: She is well-developed. She is not ill-appearing.  HENT:     Head: Normocephalic and atraumatic.  Cardiovascular:     Rate and Rhythm: Normal rate and regular rhythm. No extrasystoles are present.     Heart sounds: Normal heart sounds.     No systolic murmur is present.     No friction rub.  Pulmonary:     Effort: Pulmonary effort is normal. No respiratory distress.     Breath sounds: Normal breath sounds.  Musculoskeletal:     Cervical back: Normal range of motion and neck supple.  Skin:    General: Skin is warm and dry.  Findings: No rash.  Neurological:     Mental Status: She is alert and oriented to person, place, and time.  Psychiatric:        Mood and Affect: Mood normal.        Behavior: Behavior normal.     Wt Readings from Last 3 Encounters:  06/01/23 147 lb 2 oz (66.7 kg)  05/29/23 150 lb 6 oz (68.2 kg)  06/29/22 153 lb (69.4 kg)    BP 126/86   Pulse 90   Ht 5\' 4"  (1.626 m)   Wt 147 lb 2 oz (66.7 kg)   LMP 09/21/2014 (Exact Date)   SpO2 100%   BMI 25.25 kg/m   Assessment and Plan:  Problem List Items Addressed This Visit   None Visit Diagnoses       Chest pain at rest    -  Primary   EKG with one PAC pain has not recurred since early AM suspect pleuritic with complaint of fatigue   Relevant Orders   EKG 12-Lead     Pleuritic chest pain       Normal exam will treat empirically with steroid taper She will return if needed.   Relevant Medications   predniSONE (DELTASONE) 10 MG tablet       No follow-ups on file.    Sheron Dixons, MD [facility] [place]

## 2023-06-01 NOTE — Telephone Encounter (Signed)
 Noted  Pt was seen today.  KP

## 2023-06-01 NOTE — Telephone Encounter (Signed)
  Chief Complaint: chest pain Symptoms: chest pain, fatigue Frequency: this morning Pertinent Negatives: Patient denies fever, sob, nausea, dizziness Disposition: [] ED /[] Urgent Care (no appt availability in office) / [x] Appointment(In office/virtual)/ []  [facility]/ [] Home Care/ [] Refused Recommended Disposition /[] [facility]/ []  Follow-up with PCP Additional Notes: Patient reports she woke up at 230am this morning and was having intermittent episodes of chest pain on the left side of her chest when she would take a deep breath. Patient reports she tried taking Tums at that time with no relief. Patient reports when she woke up for the day she noticed the pain is continuing, lasting a few seconds whenever she takes a deep breath. Patient reports that she has been fatigued for the last few days, but denies all other symptoms. Per protocol, appt scheduled today 06/01/23. Patient advised to call back with worsening symptoms. Patient verbalized understanding.  Copied from CRM [id number]. Topic: Clinical - Red Word Triage >> Jun 01, 2023  8:10 AM Donna Jackson wrote: Red Word that prompted transfer to Nurse Triage: The patient is having pain in her heart area when she breathes. It started at 2:30 this morning but it is worsening and she is scared. Reason for Disposition  [1] Chest pain lasts < 5 minutes AND [2] NO chest pain or cardiac symptoms (e.g., breathing difficulty, sweating) now  (Exception: Chest pains that last only a few seconds.)  Answer Assessment - Initial Assessment Questions 1. LOCATION: "Where does it hurt?"       Left breast area 2. RADIATION: "Does the pain go anywhere else?" (e.g., into neck, jaw, arms, back)     denies 3. ONSET: "When did the chest pain begin?" (Minutes, hours or days)      This morning 4. PATTERN: "Does the pain come and go, or has it been constant since it started?"  "Does it get worse with exertion?"      Comes and goes 5. DURATION: "How long  does it last" (e.g., seconds, minutes, hours)     seconds 6. SEVERITY: "How bad is the pain?"  (e.g., Scale 1-10; mild, moderate, or severe)    - MILD (1-3): doesn't interfere with normal activities     - MODERATE (4-7): interferes with normal activities or awakens from sleep    - SEVERE (8-10): excruciating pain, unable to do any normal activities       mild 7. CARDIAC RISK FACTORS: "Do you have any history of heart problems or risk factors for heart disease?" (e.g., angina, prior heart attack; diabetes, high blood pressure, high cholesterol, smoker, or strong family history of heart disease)     denies 8. PULMONARY RISK FACTORS: "Do you have any history of lung disease?"  (e.g., blood clots in lung, asthma, emphysema, birth control pills)     denies 9. CAUSE: "What do you think is causing the chest pain?"     unsure 10. OTHER SYMPTOMS: "Do you have any other symptoms?" (e.g., dizziness, nausea, vomiting, sweating, fever, difficulty breathing, cough)       denies  Protocols used: [facility]

## 2023-07-04 ENCOUNTER — Ambulatory Visit (INDEPENDENT_AMBULATORY_CARE_PROVIDER_SITE_OTHER): Payer: Self-pay | Admitting: Internal Medicine

## 2023-07-04 ENCOUNTER — Encounter: Payer: Self-pay | Admitting: Internal Medicine

## 2023-07-04 VITALS — BP 124/82 | HR 73 | Ht 64.0 in | Wt 152.0 lb

## 2023-07-04 DIAGNOSIS — M778 Other enthesopathies, not elsewhere classified: Secondary | ICD-10-CM | POA: Diagnosis not present

## 2023-07-04 DIAGNOSIS — E785 Hyperlipidemia, unspecified: Secondary | ICD-10-CM | POA: Diagnosis not present

## 2023-07-04 DIAGNOSIS — Z79890 Hormone replacement therapy (postmenopausal): Secondary | ICD-10-CM

## 2023-07-04 DIAGNOSIS — Z0000 Encounter for general adult medical examination without abnormal findings: Secondary | ICD-10-CM | POA: Diagnosis not present

## 2023-07-04 DIAGNOSIS — R7989 Other specified abnormal findings of blood chemistry: Secondary | ICD-10-CM

## 2023-07-04 DIAGNOSIS — Z1231 Encounter for screening mammogram for malignant neoplasm of breast: Secondary | ICD-10-CM

## 2023-07-04 DIAGNOSIS — M359 Systemic involvement of connective tissue, unspecified: Secondary | ICD-10-CM

## 2023-07-04 MED ORDER — METHOCARBAMOL 500 MG PO TABS: 500.0000 mg | ORAL_TABLET | Freq: Every day | ORAL | 0 refills | Status: AC

## 2023-07-04 MED ORDER — MELOXICAM 15 MG PO TABS: 15.0000 mg | ORAL_TABLET | Freq: Every day | ORAL | 0 refills | Status: DC

## 2023-07-04 NOTE — Assessment & Plan Note (Signed)
 Followed by Rheumatology annually with labs. Currently not on any treatment.

## 2023-07-04 NOTE — Assessment & Plan Note (Signed)
 Doing well on HRT every other day with good control of vasomotor symptoms.

## 2023-07-04 NOTE — Progress Notes (Signed)
 Date:  07/04/2023   Name:  Donna Jackson   DOB:  10-Jun-1970   MRN:  [id number]   Chief Complaint: Annual Exam and Shoulder Pain (Patient said she is having left shoulder pain, and lower back, the pain is consistently there and more when moves arm up and down  ) Donna Jackson is a 52 y.o. female who presents today for her Complete Annual Exam. She feels fairly well. She reports exercising walks, 4 times a week, 2 - 3 miles. She reports she is sleeping well. Breast complaints none.  Health Maintenance  Topic Date Due   HIV Screening  Never done   DTaP/Tdap/Td vaccine (1 - Tdap) Never done   Zoster (Shingles) Vaccine (1 of 2) Never done   COVID-19 Vaccine (1 - 2024-25 season) 07/19/2024*   Flu Shot  09/07/2023   Pap with HPV screening  02/24/2024   Mammogram  04/25/2024   Colon Cancer Screening  02/13/2029   Hepatitis C Screening  Completed   HPV Vaccine  Aged Out   Meningitis B Vaccine  Aged Out  *Topic was postponed. The date shown is not the original due date.    Back Pain This is a new problem. The current episode started 1 to 4 weeks ago. The pain is present in the lumbar spine. Pertinent negatives include no abdominal pain, chest pain, dysuria, headaches or weakness.  Shoulder Pain  The pain is present in the left shoulder. This is a new problem. The current episode started 1 to 4 weeks ago. The problem occurs constantly. The quality of the pain is described as aching. The pain is moderate. Associated symptoms include a limited range of motion.    Review of Systems  Constitutional:  Negative for fatigue and unexpected weight change.  HENT:  Negative for trouble swallowing.   Eyes:  Negative for visual disturbance.  Respiratory:  Negative for cough, chest tightness, shortness of breath and wheezing.   Cardiovascular:  Negative for chest pain, palpitations and leg swelling.  Gastrointestinal:  Negative for abdominal pain, constipation and diarrhea.  Genitourinary:   Negative for dysuria and urgency.  Musculoskeletal:  Positive for arthralgias (left shoulder) and back pain. Negative for myalgias.  Neurological:  Negative for dizziness, weakness, light-headedness and headaches.  Psychiatric/Behavioral:  Negative for dysphoric mood and sleep disturbance. The patient is not nervous/anxious.      Lab Results  Component Value Date   NA 143 06/29/2022   K 4.4 06/29/2022   CO2 22 06/29/2022   GLUCOSE 92 06/29/2022   BUN 11 06/29/2022   CREATININE 0.94 06/29/2022   CALCIUM 9.1 06/29/2022   EGFR 72 06/29/2022   GFRNONAA 74 02/24/2019   Lab Results  Component Value Date   CHOL 179 06/29/2022   HDL 58 06/29/2022   LDLCALC 105 (H) 06/29/2022   TRIG 89 06/29/2022   CHOLHDL 3.1 06/29/2022   Lab Results  Component Value Date   TSH 4.570 (H) 06/29/2022   Lab Results  Component Value Date   HGBA1C 5.2 06/27/2021   Lab Results  Component Value Date   WBC 6.4 03/21/2023   HGB 14.2 03/21/2023   HCT 42 03/21/2023   MCV 98 (H) 06/29/2022   PLT 279 03/21/2023   Lab Results  Component Value Date   ALT 16 03/21/2023   AST 24 03/21/2023   ALKPHOS 76 03/21/2023   BILITOT 0.2 06/29/2022   Lab Results  Component Value Date   VD25OH 38.2 11/04/2015  Patient Active Problem List   Diagnosis Date Noted   Situational anxiety 06/29/2022   Raynaud's phenomenon without gangrene 12/27/2018   Post-menopause on HRT (hormone replacement therapy) 09/22/2014   History of nephrolithiasis 07/10/2014   Connective tissue disease, undifferentiated (HCC) 12/04/2013    Allergies  Allergen Reactions   Celecoxib Shortness Of Breath   Sulfa Antibiotics Shortness Of Breath   Amlodipine Other (See Comments)    "headaches"   Levofloxacin Other (See Comments)    Stomach upset    Past Surgical History:  Procedure Laterality Date   CESAREAN SECTION  1997   COLONOSCOPY WITH PROPOFOL  N/A 02/14/2019   Procedure: COLONOSCOPY WITH PROPOFOL ;  Surgeon: Marnee Sink,  MD;  Location: [facility] END[facility]COPY;  Service: Endoscopy;  Laterality: N/A;   DILATATION & CURRETTAGE/HYSTEROSCOPY WITH RESECTOCOPE N/A 11/23/2014   Procedure: DILATATION & CURETTAGE/HYSTEROSCOPY WITH RESECTOCOPE;  Surgeon: Colan Dash, MD;  Location: [facility] ORS;  Service: Gynecology;  Laterality: N/A;   DILATION AND CURETTAGE OF UTERUS  1996   TONSILLECTOMY     TONSILLECTOMY AND ADENOIDECTOMY  1982    Social History   Tobacco Use   Smoking status: Never   Smokeless tobacco: Never  Vaping Use   Vaping status: Never Used  Substance Use Topics   Alcohol use: Yes    Comment: occas   Drug use: No     Medication list has been reviewed and updated.  Current Meds  Medication Sig   ALPRAZolam  (XANAX ) 0.5 MG tablet Take 0.5 tablets (0.25 mg total) by mouth daily as needed for anxiety.   estrogen, conjugated,-medroxyprogesterone  (PREMPRO ) 0.625-2.5 MG tablet Take 1 tablet by mouth every other day.   hydrocortisone  (ANUSOL -HC) 2.5 % rectal cream Place 1 Application rectally 2 (two) times daily.   meloxicam  (MOBIC ) 15 MG tablet Take 1 tablet (15 mg total) by mouth daily.   methocarbamol  (ROBAXIN ) 500 MG tablet Take 1 tablet (500 mg total) by mouth at bedtime.   Multiple Vitamins-Minerals (MULTIVITAMIN WITH MINERALS) tablet Take 1 tablet by mouth daily.   Probiotic Product (PROBIOTIC-10 PO) Take by mouth.       07/04/2023    8:12 AM 06/29/2022    8:19 AM 10/11/2021    4:02 PM 06/27/2021    8:09 AM  GAD 7 : Generalized Anxiety Score  Nervous, Anxious, on Edge 0 2 1 1   Control/stop worrying 0 2 0 0  Worry too much - different things 0 2 0 1  Trouble relaxing 0 1 0 0  Restless 0 1 0 1  Easily annoyed or irritable 0 2 0 0  Afraid - awful might happen 0 0 0 0  Total GAD 7 Score 0 10 1 3   Anxiety Difficulty Not difficult at all Somewhat difficult Not difficult at all Not difficult at all       07/04/2023    8:12 AM 06/01/2023   11:37 AM 06/29/2022    8:18 AM  Depression screen PHQ 2/9   Decreased Interest 0 0 0  Down, Depressed, Hopeless 0 0 0  PHQ - 2 Score 0 0 0  Altered sleeping 0  2  Tired, decreased energy 0  1  Change in appetite 0  1  Feeling bad or failure about yourself  0  0  Trouble concentrating 0  2  Moving slowly or fidgety/restless 0  0  Suicidal thoughts 0  0  PHQ-9 Score 0  6  Difficult doing work/chores Not difficult at all  Somewhat difficult    BP  Readings from Last 3 Encounters:  07/04/23 124/82  06/01/23 126/86  05/29/23 124/76    Physical Exam Vitals and nursing note reviewed.  Constitutional:      General: She is not in acute distress.    Appearance: She is well-developed.  HENT:     Head: Normocephalic and atraumatic.     Right Ear: Tympanic membrane and ear canal normal.     Left Ear: Tympanic membrane and ear canal normal.     Nose:     Right Sinus: No maxillary sinus tenderness.     Left Sinus: No maxillary sinus tenderness.  Eyes:     General: No scleral icterus.       Right eye: No discharge.        Left eye: No discharge.     Conjunctiva/sclera: Conjunctivae normal.  Neck:     Thyroid: No thyromegaly.     Vascular: No carotid bruit.  Cardiovascular:     Rate and Rhythm: Normal rate and regular rhythm.     Pulses: Normal pulses.     Heart sounds: Normal heart sounds.  Pulmonary:     Effort: Pulmonary effort is normal. No respiratory distress.     Breath sounds: No wheezing.  Abdominal:     General: Bowel sounds are normal.     Palpations: Abdomen is soft.     Tenderness: There is no abdominal tenderness.  Musculoskeletal:     Left shoulder: Tenderness present. No deformity or laceration. Decreased range of motion.     Cervical back: Normal range of motion. No erythema.     Lumbar back: No spasms. Normal range of motion. Negative right straight leg raise test and negative left straight leg raise test.     Right lower leg: No edema.     Left lower leg: No edema.  Lymphadenopathy:     Cervical: No cervical  adenopathy.  Skin:    General: Skin is warm and dry.     Findings: No rash.  Neurological:     Mental Status: She is alert and oriented to person, place, and time.     Cranial Nerves: No cranial nerve deficit.     Sensory: No sensory deficit.     Motor: Motor function is intact.     Coordination: Coordination is intact.     Deep Tendon Reflexes: Reflexes are normal and symmetric.  Psychiatric:        Attention and Perception: Attention normal.        Mood and Affect: Mood normal.     Wt Readings from Last 3 Encounters:  07/04/23 152 lb (68.9 kg)  06/01/23 147 lb 2 oz (66.7 kg)  05/29/23 150 lb 6 oz (68.2 kg)    BP 124/82   Pulse 73   Ht 5\' 4"  (1.626 m)   Wt 152 lb (68.9 kg)   LMP 09/21/2014 (Exact Date)   SpO2 95%   BMI 26.09 kg/m   Assessment and Plan:  Problem List Items Addressed This Visit       Unprioritized   Connective tissue disease, undifferentiated (HCC) (Chronic)   Followed by Rheumatology annually with labs. Currently not on any treatment.      Post-menopause on HRT (hormone replacement therapy)   Doing well on HRT every other day with good control of vasomotor symptoms.      Other Visit Diagnoses       Annual physical exam    -  Primary   continue healthy diet and exercise consider  Shingrix vaccines   Relevant Orders   Basic metabolic panel with GFR   Lipid panel   TSH + free T4     Encounter for screening mammogram for breast cancer       done in March - BiRads 0-4     Mild hyperlipidemia       Relevant Orders   Lipid panel     Elevated TSH       Relevant Orders   TSH + free T4     Shoulder tendonitis, left       recommend heat and topical rubs if no improvement, see SM   Relevant Medications   meloxicam (MOBIC) 15 MG tablet   methocarbamol (ROBAXIN) 500 MG tablet       No follow-ups on file.    Sheron Dixons, MD [facility] [place]

## 2023-07-05 ENCOUNTER — Ambulatory Visit: Payer: Self-pay | Admitting: Internal Medicine

## 2023-07-05 LAB — BASIC METABOLIC PANEL WITH GFR
BUN/Creatinine Ratio: 18 (ref 9–23)
BUN: 16 mg/dL (ref 6–24)
CO2: 21 mmol/L (ref 20–29)
Calcium: 9.5 mg/dL (ref 8.7–10.2)
Chloride: 103 mmol/L (ref 96–106)
Creatinine, Ser: 0.91 mg/dL (ref 0.57–1.00)
Glucose: 86 mg/dL (ref 70–99)
Potassium: 4.8 mmol/L (ref 3.5–5.2)
Sodium: 142 mmol/L (ref 134–144)
eGFR: 75 mL/min/{1.73_m2} (ref >59)

## 2023-07-05 LAB — LIPID PANEL
Chol/HDL Ratio: 3.3 ratio (ref 0.0–4.4)
Cholesterol, Total: 207 mg/dL — ABNORMAL HIGH (ref 100–199)
HDL: 63 mg/dL (ref >39)
LDL Chol Calc (NIH): 128 mg/dL — ABNORMAL HIGH (ref 0–99)
Triglycerides: 90 mg/dL (ref 0–149)
VLDL Cholesterol Cal: 16 mg/dL (ref 5–40)

## 2023-07-05 LAB — TSH+FREE T4
Free T4: 1.08 ng/dL (ref 0.82–1.77)
TSH: 3.55 u[IU]/mL (ref 0.450–4.500)

## 2023-08-02 ENCOUNTER — Ambulatory Visit: Payer: Self-pay

## 2023-08-02 NOTE — Telephone Encounter (Signed)
 FYI Only or Action Required?: FYI only for provider.  Patient was last seen in primary care on 07/04/2023 by Justus Leita DEL, MD. Called Nurse Triage reporting Diarrhea. Symptoms began yesterday. Interventions attempted: Nothing. Symptoms are: unchanged.  Triage Disposition: See Physician Within 24 Hours  Patient/caregiver understands and will follow disposition?: Yes  **Pt. Scheduled with PCP on 6/27**                     Copied from CRM #[id number]. Topic: Clinical - Red Word Triage >> Aug 02, 2023  1:26 PM Delon HERO wrote: Red Word that prompted transfer to Nurse Triage: Patient is calling to report diarrhea just yesterday. Patient has not eaten today afraid the diarrhea may return. Patient reporting red marks/rash on her buttock check (right side) Previous shingles in 2016. Now Rash Not Painful. Reason for Disposition  [1] MODERATE diarrhea (e.g., 4-6 times / day more than normal) AND [2] present > 48 hours (2 days)  Answer Assessment - Initial Assessment Questions 1. DIARRHEA SEVERITY: How bad is the diarrhea? How many more stools have you had in the past 24 hours than normal?    - NO DIARRHEA (SCALE 0)   - MILD (SCALE 1-3): Few loose or mushy BMs; increase of 1-3 stools over normal daily number of stools; mild increase in ostomy output.   -  MODERATE (SCALE 4-7): Increase of 4-6 stools daily over normal; moderate increase in ostomy output.   -  SEVERE (SCALE 8-10; OR WORST POSSIBLE): Increase of 7 or more stools daily over normal; moderate increase in ostomy output; incontinence.     Moderate x 5 in the past 24 hours   2. ONSET: When did the diarrhea begin?      X 1 day  3. BM CONSISTENCY: How loose or watery is the diarrhea?      Loose/ watery   4. VOMITING: Are you also vomiting? If Yes, ask: How many times in the past 24 hours?     No   5. ABDOMEN PAIN: Are you having any abdomen pain? If Yes, ask: What does it feel like? (e.g., crampy,  dull, intermittent, constant)      No    8. HYDRATION: Any signs of dehydration? (e.g., dry mouth [not just dry lips], too weak to stand, dizziness, new weight loss) When did you last urinate?                   No, but reports some lightheadedness yesterday, now resolved        9. EXPOSURE: Have you traveled to a foreign country recently? Have you been exposed to anyone with diarrhea? Could you have eaten any food that was spoiled?       No   10. ANTIBIOTIC USE: Are you taking antibiotics now or have you taken antibiotics in the past 2 months?       No   11. OTHER SYMPTOMS: Do you have any other symptoms? (e.g., fever, blood in stool)       Rash on her right buttock cheek. Unknown origin. She reports shingles in the past, but this is different. No pain. She states the rash is purple, shaped in a line. No itching.  Protocols used: [facility]

## 2023-08-02 NOTE — Telephone Encounter (Signed)
 Noted  Pt has a appt.  KP

## 2023-08-03 ENCOUNTER — Ambulatory Visit: Admitting: Internal Medicine

## 2023-08-03 ENCOUNTER — Encounter: Payer: Self-pay | Admitting: Internal Medicine

## 2023-08-03 VITALS — BP 116/70 | HR 86 | Ht 64.0 in | Wt 150.0 lb

## 2023-08-03 DIAGNOSIS — F418 Other specified anxiety disorders: Secondary | ICD-10-CM

## 2023-08-03 DIAGNOSIS — K591 Functional diarrhea: Secondary | ICD-10-CM

## 2023-08-03 DIAGNOSIS — T148XXA Other injury of unspecified body region, initial encounter: Secondary | ICD-10-CM

## 2023-08-03 NOTE — Progress Notes (Signed)
 Date:  08/03/2023   Name:  Donna Jackson   DOB:  [birth year]   MRN:  [id number]   Chief Complaint: Diarrhea (Started 3 days ago. No blood or mucous. After eating, food is going straight through me. Scared to eat because of this. ) and Rash (X2 days ago noticed a bruise/rash on right buttocks. Unsure of where this came from. Not painful or itching. Pt had shingles in 2016 on her buttocks, but this is different.)  Diarrhea  This is a new problem. Episode onset: three days ago. The problem occurs 2 to 4 times per day (triggered by eating). The problem has been unchanged. Pertinent negatives include no abdominal pain, chills, coughing, fever or weight loss. The symptoms are aggravated by stress (her best friend passed away three days ago). She has tried nothing for the symptoms.  Rash This is a new problem. The current episode started yesterday. Associated symptoms include diarrhea. Pertinent negatives include no cough or fever.  This looks like a bruise but she is concerned about possible shingles.  Review of Systems  Constitutional:  Negative for chills, fever and weight loss.  Respiratory:  Negative for cough.   Gastrointestinal:  Positive for diarrhea. Negative for abdominal pain.  Skin:  Positive for rash.     Lab Results  Component Value Date   NA 142 07/04/2023   K 4.8 07/04/2023   CO2 21 07/04/2023   GLUCOSE 86 07/04/2023   BUN 16 07/04/2023   CREATININE 0.91 07/04/2023   CALCIUM 9.5 07/04/2023   EGFR 75 07/04/2023   GFRNONAA 74 02/24/2019   Lab Results  Component Value Date   CHOL 207 (H) 07/04/2023   HDL 63 07/04/2023   LDLCALC 128 (H) 07/04/2023   TRIG 90 07/04/2023   CHOLHDL 3.3 07/04/2023   Lab Results  Component Value Date   TSH 3.550 07/04/2023   Lab Results  Component Value Date   HGBA1C 5.2 06/27/2021   Lab Results  Component Value Date   WBC 6.4 03/21/2023   HGB 14.2 03/21/2023   HCT 42 03/21/2023   MCV 98 (H) 06/29/2022   PLT 279 03/21/2023    Lab Results  Component Value Date   ALT 16 03/21/2023   AST 24 03/21/2023   ALKPHOS 76 03/21/2023   BILITOT 0.2 06/29/2022   Lab Results  Component Value Date   VD25OH 38.2 11/04/2015     Patient Active Problem List   Diagnosis Date Noted   Situational anxiety 06/29/2022   Raynaud's phenomenon without gangrene 12/27/2018   Post-menopause on HRT (hormone replacement therapy) 09/22/2014   History of nephrolithiasis 07/10/2014   Connective tissue disease, undifferentiated (HCC) 12/04/2013    Allergies  Allergen Reactions   Celecoxib Shortness Of Breath   Sulfa Antibiotics Shortness Of Breath   Amlodipine Other (See Comments)    headaches   Levofloxacin Other (See Comments)    Stomach upset    Past Surgical History:  Procedure Laterality Date   CESAREAN SECTION  1997   COLONOSCOPY WITH PROPOFOL  N/A 02/14/2019   Procedure: COLONOSCOPY WITH PROPOFOL ;  Surgeon: Jinny Carmine, MD;  Location: [facility] ENDOSCOPY;  Service: Endoscopy;  Laterality: N/A;   DILATATION & CURRETTAGE/HYSTEROSCOPY WITH RESECTOCOPE N/A 11/23/2014   Procedure: DILATATION & CURETTAGE/HYSTEROSCOPY WITH RESECTOCOPE;  Surgeon: Gladis DELENA Dollar, MD;  Location: [facility] ORS;  Service: Gynecology;  Laterality: N/A;   DILATION AND CURETTAGE OF UTERUS  1996   TONSILLECTOMY     TONSILLECTOMY AND ADENOIDECTOMY  1982  Social History   Tobacco Use   Smoking status: Never   Smokeless tobacco: Never  Vaping Use   Vaping status: Never Used  Substance Use Topics   Alcohol use: Yes    Comment: occas   Drug use: No     Medication list has been reviewed and updated.  Current Meds  Medication Sig   ALPRAZolam  (XANAX ) 0.5 MG tablet Take 0.5 tablets (0.25 mg total) by mouth daily as needed for anxiety.   estrogen, conjugated,-medroxyprogesterone  (PREMPRO ) 0.625-2.5 MG tablet Take 1 tablet by mouth every other day.   hydrocortisone  (ANUSOL -HC) 2.5 % rectal cream Place 1 Application rectally 2 (two) times daily.    methocarbamol  (ROBAXIN ) 500 MG tablet Take 1 tablet (500 mg total) by mouth at bedtime.   Multiple Vitamins-Minerals (MULTIVITAMIN WITH MINERALS) tablet Take 1 tablet by mouth daily.   Probiotic Product (PROBIOTIC-10 PO) Take by mouth.   [DISCONTINUED] meloxicam  (MOBIC ) 15 MG tablet Take 1 tablet (15 mg total) by mouth daily.       08/03/2023    1:52 PM 07/04/2023    8:12 AM 06/29/2022    8:19 AM 10/11/2021    4:02 PM  GAD 7 : Generalized Anxiety Score  Nervous, Anxious, on Edge 2 0 2 1  Control/stop worrying 2 0 2 0  Worry too much - different things 2 0 2 0  Trouble relaxing 2 0 1 0  Restless 2 0 1 0  Easily annoyed or irritable 1 0 2 0  Afraid - awful might happen 0 0 0 0  Total GAD 7 Score 11 0 10 1  Anxiety Difficulty Somewhat difficult Not difficult at all Somewhat difficult Not difficult at all       08/03/2023    1:52 PM 07/04/2023    8:12 AM 06/01/2023   11:37 AM  Depression screen PHQ 2/9  Decreased Interest 1 0 0  Down, Depressed, Hopeless 2 0 0  PHQ - 2 Score 3 0 0  Altered sleeping 2 0   Tired, decreased energy 2 0   Change in appetite 2 0   Feeling bad or failure about yourself  0 0   Trouble concentrating 2 0   Moving slowly or fidgety/restless 1 0   Suicidal thoughts 0 0   PHQ-9 Score 12 0   Difficult doing work/chores Somewhat difficult Not difficult at all     BP Readings from Last 3 Encounters:  08/03/23 116/70  07/04/23 124/82  06/01/23 126/86    Physical Exam Vitals and nursing note reviewed.  Constitutional:      General: She is not in acute distress.    Appearance: Normal appearance. She is well-developed.  HENT:     Head: Normocephalic and atraumatic.   Cardiovascular:     Rate and Rhythm: Normal rate and regular rhythm.  Pulmonary:     Effort: Pulmonary effort is normal. No respiratory distress.     Breath sounds: No wheezing or rhonchi.  Abdominal:     General: Abdomen is flat. Bowel sounds are normal.     Palpations: Abdomen is  soft.     Tenderness: There is no abdominal tenderness. There is no guarding or rebound.     Hernia: No hernia is present.   Musculoskeletal:     Cervical back: Normal range of motion.  Lymphadenopathy:     Cervical: No cervical adenopathy.   Skin:    General: Skin is warm and dry.     Findings: Ecchymosis present. No rash.  Comments: Irregularly shaped bruise - resolving - no tenderness or warmth   Neurological:     Mental Status: She is alert and oriented to person, place, and time.   Psychiatric:        Attention and Perception: Attention normal.        Mood and Affect: Mood normal. Affect is tearful.        Speech: Speech normal.        Behavior: Behavior normal.        Cognition and Memory: Cognition normal.        Judgment: Judgment normal.     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 She has panic attacks more often and worries about the change. She requests a letter, if needed, to request work from home exception.      Other Visit Diagnoses       Functional diarrhea    -  Primary   no red flag s/s likely stress induced since her best friend passed away encourage more fluids can use imodium bid as needed     Bruise       benign appearing and resolving patient is reassured       No follow-ups on file.    Leita HILARIO Adie, MD [facility] [place]

## 2023-08-03 NOTE — Assessment & Plan Note (Addendum)
 She is having more anxiety - her employer is considering bringing employees back to the office. She has panic attacks more often and worries about the change. She requests a letter, if needed, to request work from home exception.

## 2023-08-03 NOTE — Patient Instructions (Signed)
 Imodium 2 mg - take one 1-2 hours before you want to leave the house and repeat in 4 hours if needed.

## 2023-08-08 ENCOUNTER — Other Ambulatory Visit: Payer: Self-pay | Admitting: Internal Medicine

## 2023-08-08 DIAGNOSIS — Z79890 Hormone replacement therapy (postmenopausal): Secondary | ICD-10-CM

## 2023-08-10 NOTE — Telephone Encounter (Signed)
 Requested Prescriptions  Pending Prescriptions Disp Refills   PREMPRO  0.625-2.5 MG tablet [Pharmacy Med Name: PREMPRO  0.625MG /2.5MG  TAB28 PEACH] 28 tablet 5    Sig: TAKE 1 TABLET BY MOUTH EVERY OTHER DAY     OB/GYN:  Hormone Combinations Passed - 08/10/2023  4:19 PM      Passed - Mammogram is up-to-date per Health Maintenance      Passed - Last BP in normal range    BP Readings from Last 1 Encounters:  08/03/23 116/70         Passed - Valid encounter within last 12 months    Recent Outpatient Visits           1 week ago Functional diarrhea   [place] [facility] at [facility], Leita DEL, MD   1 month ago Annual physical exam   [facility] at [facility], Leita DEL, MD   2 months ago Chest pain at rest   [facility] at [facility], Leita DEL, MD   2 months ago Rectal bleeding   [facility] at [facility], Leita DEL, MD

## 2024-01-29 ENCOUNTER — Telehealth: Payer: Self-pay

## 2024-01-29 ENCOUNTER — Other Ambulatory Visit: Payer: Self-pay | Admitting: Internal Medicine

## 2024-01-29 DIAGNOSIS — F418 Other specified anxiety disorders: Secondary | ICD-10-CM

## 2024-01-29 NOTE — Telephone Encounter (Unsigned)
 Copied from CRM #[id number]. Topic: Clinical - Medication Refill >> Jan 29, 2024 12:02 PM Joesph B wrote: Medication: ALPRAZolam  (XANAX ) 0.5 MG tablet [[id number]]  Has the patient contacted their pharmacy? Yes (Agent: If no, request that the patient contact the pharmacy for the refill. If patient does not wish to contact the pharmacy document the reason why and proceed with request.) (Agent: If yes, when and what did the pharmacy advise?)  This is the patient's preferred pharmacy:  [id number], [place] - [address] AT [facility] [address] [facility] [address] [place] [address] Phone: [phone] Fax: [phone]  Is this the correct pharmacy for this prescription? Yes If no, delete pharmacy and type the correct one.   Has the prescription been filled recently? Yes  Is the patient out of the medication? Yes  Has the patient been seen for an appointment in the last year OR does the patient have an upcoming appointment? Yes  Can we respond through MyChart? Yes  Agent: Please be advised that Rx refills may take up to 3 business days. We ask that you follow-up with your pharmacy.

## 2024-01-29 NOTE — Telephone Encounter (Signed)
 Copied from CRM #[id number]. Topic: General - Other >> Jan 29, 2024 12:04 PM Joesph NOVAK wrote: Reason for CRM: patient mentioned she spoke to Dr.Berglund about writing her a letter In regards to working from home. She stated she is not sure when they will start working back in the office and the letter will keep her at home. Please advise.

## 2024-01-30 NOTE — Telephone Encounter (Signed)
 Please call pt to schedule an appt with new provider.  KP

## 2024-01-30 NOTE — Telephone Encounter (Signed)
 Requested medication (s) are due for refill today: Yes  Requested medication (s) are on the active medication list: Yes  Last refill:  06/29/22  Future visit scheduled: No  Notes to clinic:  Unable to refill per protocol, cannot delegate.      Requested Prescriptions  Pending Prescriptions Disp Refills   ALPRAZolam  (XANAX ) 0.5 MG tablet 10 tablet 0    Sig: Take 0.5 tablets (0.25 mg total) by mouth daily as needed for anxiety.     Not Delegated - Psychiatry: Anxiolytics/Hypnotics 2 Failed - 01/30/2024  1:17 PM      Failed - This refill cannot be delegated      Failed - Urine Drug Screen completed in last 360 days      Failed - Valid encounter within last 6 months    Recent Outpatient Visits           6 months ago Functional diarrhea   [place] [facility], Leita DEL, MD   7 months ago Annual physical exam   [facility] at [facility], Leita DEL, MD   8 months ago Chest pain at rest   [facility] at [facility], Leita DEL, MD   8 months ago Rectal bleeding   [facility] at [facility], Leita DEL, MD              Passed - Patient is not pregnant

## 2024-02-04 MED ORDER — ALPRAZOLAM 0.5 MG PO TABS: 0.2500 mg | ORAL_TABLET | Freq: Every day | ORAL | 0 refills | Status: AC | PRN

## 2024-02-04 NOTE — Telephone Encounter (Signed)
 Please review.  KP
